# Patient Record
Sex: Male | Born: 1985 | Race: White | Hispanic: No | Marital: Single | Smoking: Former smoker
Health system: Southern US, Community
[De-identification: ages and names within clinical notes are randomized; demographics above are authoritative.]

## PROBLEM LIST (undated history)

## (undated) DIAGNOSIS — G4730 Sleep apnea, unspecified: Secondary | ICD-10-CM

## (undated) DIAGNOSIS — F988 Other specified behavioral and emotional disorders with onset usually occurring in childhood and adolescence: Secondary | ICD-10-CM

## (undated) DIAGNOSIS — F329 Major depressive disorder, single episode, unspecified: Secondary | ICD-10-CM

## (undated) DIAGNOSIS — I319 Disease of pericardium, unspecified: Secondary | ICD-10-CM

## (undated) DIAGNOSIS — T6701XA Heatstroke and sunstroke, initial encounter: Secondary | ICD-10-CM

## (undated) DIAGNOSIS — M6282 Rhabdomyolysis: Secondary | ICD-10-CM

## (undated) DIAGNOSIS — F4310 Post-traumatic stress disorder, unspecified: Secondary | ICD-10-CM

## (undated) DIAGNOSIS — F1020 Alcohol dependence, uncomplicated: Secondary | ICD-10-CM

## (undated) DIAGNOSIS — F32A Depression, unspecified: Secondary | ICD-10-CM

## (undated) HISTORY — DX: Depression, unspecified: F32.A

## (undated) HISTORY — DX: Post-traumatic stress disorder, unspecified: F43.10

## (undated) HISTORY — DX: Sleep apnea, unspecified: G47.30

## (undated) HISTORY — DX: Major depressive disorder, single episode, unspecified: F32.9

## (undated) HISTORY — DX: Other specified behavioral and emotional disorders with onset usually occurring in childhood and adolescence: F98.8

## (undated) HISTORY — PX: KNEE SURGERY: SHX244

## (undated) HISTORY — PX: MUSCLE BIOPSY: SHX716

---
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
## 2007-06-14 ENCOUNTER — Emergency Department (HOSPITAL_COMMUNITY): Admission: EM | Admit: 2007-06-14 | Discharge: 2007-06-14 | Payer: Self-pay | Admitting: Emergency Medicine

## 2008-12-04 ENCOUNTER — Emergency Department (HOSPITAL_COMMUNITY): Admission: EM | Admit: 2008-12-04 | Discharge: 2008-12-04 | Payer: Self-pay | Admitting: Emergency Medicine

## 2010-01-23 ENCOUNTER — Encounter: Payer: Self-pay | Admitting: Orthopaedic Surgery

## 2010-04-05 LAB — BASIC METABOLIC PANEL WITH GFR
Calcium: 9 mg/dL (ref 8.4–10.5)
GFR calc Af Amer: >60 mL/min (ref >60)
GFR calc non Af Amer: >60 mL/min (ref >60)
Glucose, Bld: 91 mg/dL (ref 70–99)
Sodium: 139 meq/L (ref 135–145)

## 2010-04-05 LAB — DIFFERENTIAL
Basophils Absolute: 0 10*3/uL (ref 0.0–0.1)
Basophils Relative: 0 % (ref 0–1)
Eosinophils Absolute: 0.2 10*3/uL (ref 0.0–0.7)
Eosinophils Relative: 3 % (ref 0–5)
Lymphocytes Relative: 18 % (ref 12–46)
Lymphs Abs: 1.5 10*3/uL (ref 0.7–4.0)
Monocytes Absolute: 0.8 10*3/uL (ref 0.1–1.0)
Monocytes Relative: 10 % (ref 3–12)
Neutro Abs: 5.6 K/uL (ref 1.7–7.7)
Neutrophils Relative %: 69 % (ref 43–77)

## 2010-04-05 LAB — URINALYSIS, ROUTINE W REFLEX MICROSCOPIC
Bilirubin Urine: NEGATIVE
Glucose, UA: NEGATIVE mg/dL
Hgb urine dipstick: NEGATIVE
Ketones, ur: NEGATIVE mg/dL
Nitrite: NEGATIVE
Protein, ur: NEGATIVE mg/dL
Specific Gravity, Urine: 1.025 (ref 1.005–1.030)
Urobilinogen, UA: 0.2 mg/dL (ref 0.0–1.0)
pH: 6.5 (ref 5.0–8.0)

## 2010-04-05 LAB — CK
Total CK: 5500 U/L — ABNORMAL HIGH (ref 7–232)
Total CK: 6325 U/L — ABNORMAL HIGH (ref 7–232)

## 2010-04-05 LAB — BASIC METABOLIC PANEL
BUN: 16 mg/dL (ref 6–23)
CO2: 27 mEq/L (ref 19–32)
Chloride: 105 mEq/L (ref 96–112)
Creatinine, Ser: 0.84 mg/dL (ref 0.4–1.5)
Potassium: 3.7 mEq/L (ref 3.5–5.1)

## 2010-04-05 LAB — CBC
HCT: 44 % (ref 39.0–52.0)
Hemoglobin: 15.5 g/dL (ref 13.0–17.0)
MCHC: 35.3 g/dL (ref 30.0–36.0)
MCV: 90.5 fL (ref 78.0–100.0)
Platelets: 226 10*3/uL (ref 150–400)
RBC: 4.86 MIL/uL (ref 4.22–5.81)
RDW: 12.7 % (ref 11.5–15.5)
WBC: 8.2 10*3/uL (ref 4.0–10.5)

## 2010-05-20 NOTE — Op Note (Signed)
Vincent Ponce, Vincent Ponce             ACCOUNT NO.:  [id number]   MEDICAL RECORD NO.:  [id number]          PATIENT TYPE:  AMB   LOCATION:  [facility]                          FACILITY:  [facility]   PHYSICIAN:  Vincent Ponce, M.D.DATE OF BIRTH:  04/24/1985   DATE OF PROCEDURE:  07/19/2004  DATE OF DISCHARGE:                                 OPERATIVE REPORT   PREOPERATIVE DIAGNOSES:  1.  Left knee torn medial meniscus.  2.  Left knee retained hardware.   POSTOPERATIVE DIAGNOSES:  1.  Left knee torn medial meniscus.  2.  Left knee retained hardware.   PROCEDURES:  1.  Left knee partial medial meniscectomy.  2.  Left knee attempted hardware removal.   ANESTHESIA:  General.   ATTENDING SURGEON:  Vincent Ponce, M.D.   ASSISTANT:  Vincent Ponce, P.A.   INDICATION FOR PROCEDURE:  The patient is a 25 year old male a couple of  years from an [facility]once reconstruction.  Unfortunately, he injured his knee  recently and by MRI and exam, has a torn medial meniscus which is displaced  and locked in an unfavorable position.  The Vincent Ponce appears to be intact.  He is  offered a partial medial meniscectomy through the scope.  He is planning to  enter the [facility]once and wishes to have his hardware removed as well as  that might affect his admission to the service.  Informed operative consent  was obtained after discussion of possible complications of reaction to  anesthesia and infection.   DESCRIPTION OF PROCEDURE:  The patient was in an operating suite where a  general anesthetic was applied without difficulty.  He was positioned supine  and prepped and draped in normal sterile fashion.  After the administration  of preop IV antibiotics, an arthroscopy of the left knee was performed  through 2 old inferior portals.  The suprapatellar pouch was benign, as was  the patellofemoral joint.  Medial compartment did exhibit an avascular  medial meniscus tear, bucket-handle, stuck in an anterior position.  About  a  15% partial medial meniscectomy was done, removing this tissue.  The rest of  the meniscus appeared intact and was shaved back to stable margins.  There  were no degenerative changes in this compartment.  The Vincent Ponce appeared to be  intact and functional.  The PCL also was intact.  The lateral compartment  exhibited no evidence of meniscal or articular cartilage injury.  At this  point, I probed the proximal portion of the Vincent Ponce origin and could not find  any obvious hardware.  I used a shaver to remove some of the soft tissue in  this area and could not see any exposed hardware.  We then brought the  fluoroscopy machine into place and I was in the correct location, but it  appeared as though the hardware was likely buried.  I was concerned that  this might destabilize his Vincent Ponce reconstruction to dig after this hardware and  elected not to remove the screw.  The knee was thoroughly irrigated followed  by placement Marcaine with epinephrine and morphine.  Simple sutures of  nylon were used to loosely reapproximate the 2 portals, plus an additional  central portal which was made for probing the Vincent Ponce insertion site.  Estimated  blood loss and intraoperative fluids can be obtained from anesthesia  records.   DISPOSITION:  The patient was extubated in the operating room and taken to  recovery room in stable condition.  Plans were for him to go home the same  day and to follow up in the office in less than a week.  I will contact him  by phone tonight.       PGD/MEDQ  D:  07/19/2004  T:  07/20/2004  Job:  [id number]

## 2010-05-20 NOTE — Op Note (Signed)
NAME:  Vincent Ponce, Vincent Ponce                       ACCOUNT NO.:  [id number]   MEDICAL RECORD NO.:  [id number]                   PATIENT TYPE:  AMB   LOCATION:  [facility]                                  FACILITY:  [facility]   PHYSICIAN:  Lubertha Basque. Jerl Santos, M.D.             DATE OF BIRTH:  1985-02-22   DATE OF PROCEDURE:  09/19/2001  DATE OF DISCHARGE:  09/19/2001                                 OPERATIVE REPORT   PREOPERATIVE DIAGNOSES:  1. Left knee anterior cruciate ligament tear.  2. Left knee torn medial meniscus.   POSTOPERATIVE DIAGNOSES:  1. Left knee anterior cruciate ligament tear.  2. Left knee chondromalacia of the patella.   PROCEDURE:  1. Left knee anterior cruciate ligament reconstruction.  2. Left knee chrondroplasty.   ANESTHESIA:  General and postoperative block.   ATTENDING SURGEON:  Lubertha Basque. Jerl Santos, M.D.   ASSISTANT:  Prince Rome, P.A.   INDICATIONS FOR PROCEDURE:  The patient is a 25 year old boy, a few weeks  from a sports injury where his knee buckled and gave away.  It swelled in an  immediate fashion.  He has been left with instability on exam, and an MRI  shows a complete ACL tear.  He would like to be involved once again in  cutting and jumping sports, and as a result, he is having operative ACL  reconstruction in hopes of stabilizing his knee enough to allow  participation in those activities.  The procedure was discussed with patient  and his father, and an informed operative consent was obtained after  discussing the possible complications of reaction to anesthesia, infection,  and knee stiffness.  He also understood about the extensive rehabilitation  program required after this type of intervention.   DESCRIPTION OF PROCEDURE:  The patient was brought to the operating suite  where a general anesthetic was started without difficulty.  He was  positioned supine and prepped and draped in a normal sterile fashion.  After  the administration of  preoperative antibiotics, an arthroscopy of the left  knee was performed through two inferior portals.  The suprapatellar pouch  was benign while the patellofemoral joint exhibited some mild  chondromalacia, dressed with a brief chondroplasty of the patella.  The  patella tracked well.  Medial and lateral compartments exhibited no evidence  of meniscal articular cartilage injury.  The ACL was found to be torn at the  femoral attachment.  The PCL was intact.  The ACL was removed, followed by a  thorough notchplasty with an arthroscopic bur.  At this point, the leg was  elevated, exsanguinated, and a tourniquet inflated about the thigh.  Anteromedially, an incision was made along the medial border of the patellar  tendon.  Dissection was carried down to this structure through the  peritenon.  The middle third of the patellar tendon was harvested with  contiguous bone blocks from the tibial tubercle and the patella.  This  structure was taken to a back table where __________ fit through 10 and 11  mm tunnels.  Drill holes were placed in both of the bone plugs with a wire  placed to one and a suture placed on the other.  A guide was placed inside  the knee which made possible the placement of a guide wire from anterior  into the knee, just anterior to the PCL insertion on the tibia.  This was  overeamed to a diameter of 11 mm.  A transtibial guide was then placed  through this tunnel in the over-the-top position.  This was utilized to  place a guide wire through the tibial tunnel, the distal femur, and the  thigh.  We then used this guide wire to overream the distal femur to a  diameter of 10 mm and a depth of 3 cm with a 1-2 mm posterior wall being  well-visualized.  A full radius resector was placed in the knee to remove  bony debris.  The aforementioned graft was then pulled into position through  the tibial tunnel and into the femoral tunnel.  Care was taken to keep the  tendinous surface  of the graft in a posterior position.  An 8 x 25 metal  Linvatec screw was then placed over a guide wire in the femoral tunnel.  This secured this bone plug very well.  The knee was then ranged fully, and  the graft was found to be quite isometric with no impingement at full  extension.  A second guide wire was placed through the tibial tunnel and was  seen to enter the knee.  An identical screw was placed in identical  interference fashion over this guide wire to secure the trailing bone plug  in the tibial tunnel.  Again, the knee ranged fully, and the graft was found  to be quite taut.  The knee was irrigated, followed by removal of  arthroscopic equipment.  Some residual bone chips from graft preparation  were placed into the patellar defect, followed by closure of the peritenon  with 0 Vicryl suture in a running fashion.  The tourniquet was deflated.  A  small amount of bleeding was easily controlled with Bovie cautery.  2-0  undyed Vicryl was used to reapproximate the subcutaneous tissues, followed  by nylon in the skin.  Adaptic was placed over the wound, followed by dry  gauze and a loose Ace wrap.  A femoral nerve block was scheduled to be given  by the anesthesiologist at the end of the case.  Estimated blood loss,  intraoperative fluids, as well as tourniquet time can be obtained from the  anesthesia records.   DISPOSITION:  The patient was extubated in the operating room and taken to  the recovery room in a stable condition. Plans were for him to stay  overnight for pain control with probable discharge home in the morning.                                               Lubertha Basque Jerl Santos, M.D.    PGD/MEDQ  D:  09/19/2001  T:  09/21/2001  Job:  [id number]

## 2010-09-29 LAB — INFLUENZA A+B VIRUS AG-DIRECT(RAPID)
Inflenza A Ag: NEGATIVE
Influenza B Ag: NEGATIVE

## 2011-03-31 ENCOUNTER — Encounter (HOSPITAL_COMMUNITY): Payer: Self-pay | Admitting: *Deleted

## 2011-03-31 ENCOUNTER — Emergency Department (HOSPITAL_COMMUNITY)
Admission: EM | Admit: 2011-03-31 | Discharge: 2011-03-31 | Disposition: A | Discharge status: Home / Self Care | Payer: 59 | Attending: Emergency Medicine | Admitting: Emergency Medicine

## 2011-03-31 DIAGNOSIS — R110 Nausea: Secondary | ICD-10-CM | POA: Insufficient documentation

## 2011-03-31 DIAGNOSIS — IMO0001 Reserved for inherently not codable concepts without codable children: Secondary | ICD-10-CM | POA: Insufficient documentation

## 2011-03-31 DIAGNOSIS — R5383 Other fatigue: Secondary | ICD-10-CM | POA: Insufficient documentation

## 2011-03-31 DIAGNOSIS — R5381 Other malaise: Secondary | ICD-10-CM | POA: Insufficient documentation

## 2011-03-31 DIAGNOSIS — E860 Dehydration: Secondary | ICD-10-CM | POA: Insufficient documentation

## 2011-03-31 DIAGNOSIS — M6282 Rhabdomyolysis: Secondary | ICD-10-CM

## 2011-03-31 DIAGNOSIS — K5289 Other specified noninfective gastroenteritis and colitis: Secondary | ICD-10-CM | POA: Insufficient documentation

## 2011-03-31 DIAGNOSIS — R197 Diarrhea, unspecified: Secondary | ICD-10-CM | POA: Insufficient documentation

## 2011-03-31 DIAGNOSIS — K529 Noninfective gastroenteritis and colitis, unspecified: Secondary | ICD-10-CM

## 2011-03-31 HISTORY — DX: Heatstroke and sunstroke, initial encounter: T67.01XA

## 2011-03-31 HISTORY — DX: Rhabdomyolysis: M62.82

## 2011-03-31 HISTORY — DX: Alcohol dependence, uncomplicated: F10.20

## 2011-03-31 LAB — BASIC METABOLIC PANEL
CO2: 24 mEq/L (ref 19–32)
Chloride: 98 mEq/L (ref 96–112)
GFR calc Af Amer: >90 mL/min (ref >90)
Potassium: 3.4 mEq/L — ABNORMAL LOW (ref 3.5–5.1)

## 2011-03-31 LAB — CK: Total CK: 953 U/L — ABNORMAL HIGH (ref 7–232)

## 2011-03-31 LAB — URINALYSIS, ROUTINE W REFLEX MICROSCOPIC
Glucose, UA: NEGATIVE mg/dL
Leukocytes, UA: NEGATIVE
Nitrite: NEGATIVE
Specific Gravity, Urine: <1.005 — ABNORMAL LOW (ref 1.005–1.030)
pH: 6 (ref 5.0–8.0)

## 2011-03-31 LAB — URINE MICROSCOPIC-ADD ON

## 2011-03-31 MED ORDER — DIPHENOXYLATE-ATROPINE 2.5-0.025 MG PO TABS
1.0000 | ORAL_TABLET | Freq: Once | ORAL | Status: AC
  Administered 2011-03-31: 1 via ORAL
  Filled 2011-03-31: qty 1

## 2011-03-31 MED ORDER — PROMETHAZINE HCL 25 MG PO TABS: 25.0000 mg | ORAL_TABLET | Freq: Four times a day (QID) | ORAL | Status: DC | PRN

## 2011-03-31 MED ORDER — SODIUM CHLORIDE 0.9 % IV BOLUS (SEPSIS)
1000.0000 mL | Freq: Once | INTRAVENOUS | Status: AC
  Administered 2011-03-31: 1000 mL via INTRAVENOUS

## 2011-03-31 MED ORDER — ONDANSETRON HCL 4 MG/2ML IJ SOLN
4.0000 mg | Freq: Once | INTRAMUSCULAR | Status: AC
  Administered 2011-03-31: 4 mg via INTRAVENOUS
  Filled 2011-03-31: qty 2

## 2011-03-31 NOTE — Discharge Instructions (Signed)
Dehydration, Adult Dehydration is when you lose more fluids from the body than you take in. Vital organs like the kidneys, brain, and heart cannot function without a proper amount of fluids and salt. Any loss of fluids from the body can cause dehydration.  CAUSES   Vomiting.   Diarrhea.   Excessive sweating.   Excessive urine output.   Fever.  SYMPTOMS  Mild dehydration  Thirst.   Dry lips.   Slightly dry mouth.  Moderate dehydration  Very dry mouth.   Sunken eyes.   Skin does not bounce back quickly when lightly pinched and released.   Dark urine and decreased urine production.   Decreased tear production.   Headache.  Severe dehydration  Very dry mouth.   Extreme thirst.   Rapid, weak pulse (more than 100 beats per minute at rest).   Cold hands and feet.   Not able to sweat in spite of heat and temperature.   Rapid breathing.   Blue lips.   Confusion and lethargy.   Difficulty being awakened.   Minimal urine production.   No tears.  DIAGNOSIS  Your caregiver will diagnose dehydration based on your symptoms and your exam. Blood and urine tests will help confirm the diagnosis. The diagnostic evaluation should also identify the cause of dehydration. TREATMENT  Treatment of mild or moderate dehydration can often be done at home by increasing the amount of fluids that you drink. It is best to drink small amounts of fluid more often. Drinking too much at one time can make vomiting worse. Refer to the home care instructions below. Severe dehydration needs to be treated at the hospital where you will probably be given intravenous (IV) fluids that contain water and electrolytes. HOME CARE INSTRUCTIONS   Ask your caregiver about specific rehydration instructions.   Drink enough fluids to keep your urine clear or pale yellow.   Drink small amounts frequently if you have nausea and vomiting.   Eat as you normally do.   Avoid:   Foods or drinks high in  sugar.   Carbonated drinks.   Juice.   Extremely hot or cold fluids.   Drinks with caffeine.   Fatty, greasy foods.   Alcohol.   Tobacco.   Overeating.   Gelatin desserts.   Wash your hands well to avoid spreading bacteria and viruses.   Only take over-the-counter or prescription medicines for pain, discomfort, or fever as directed by your caregiver.   Ask your caregiver if you should continue all prescribed and over-the-counter medicines.   Keep all follow-up appointments with your caregiver.  SEEK MEDICAL CARE IF:  You have abdominal pain and it increases or stays in one area (localizes).   You have a rash, stiff neck, or severe headache.   You are irritable, sleepy, or difficult to awaken.   You are weak, dizzy, or extremely thirsty.  SEEK IMMEDIATE MEDICAL CARE IF:   You are unable to keep fluids down or you get worse despite treatment.   You have frequent episodes of vomiting or diarrhea.   You have blood or green matter (bile) in your vomit.   You have blood in your stool or your stool looks black and tarry.   You have not urinated in 6 to 8 hours, or you have only urinated a small amount of very dark urine.   You have a fever.   You faint.  MAKE SURE YOU:   Understand these instructions.   Will watch your condition.     Will get help right away if you are not doing well or get worse.  Document Released: 12/19/2004 Document Revised: 12/08/2010 Document Reviewed: 08/08/2010 [employer] Patient Information 2012 [employer]. 

## 2011-03-31 NOTE — ED Notes (Signed)
Pt alert & oriented x4, stable gait. Pt given discharge instructions, paperwork & prescription(s). Patient instructed to stop at the registration desk to finish any additional paperwork. pt verbalized understanding. Pt left department w/ no further questions.  

## 2011-03-31 NOTE — ED Provider Notes (Signed)
History     CSN: [id number]  Arrival date & time 03/31/11  1709   First MD Initiated Contact with Patient 03/31/11 1744      Chief Complaint  Patient presents with  . Fatigue  . Diarrhea    (Consider location/radiation/quality/duration/timing/severity/associated sxs/prior treatment) Patient is a 26 y.o. male presenting with diarrhea. The history is provided by the patient. No language interpreter was used.  Diarrhea The primary symptoms include fatigue, nausea, diarrhea and myalgias. Primary symptoms do not include fever, weight loss, abdominal pain, vomiting, dysuria or arthralgias. The illness began yesterday. The onset was gradual. The problem has been gradually worsening.  The fatigue began yesterday. The fatigue has been worsening since its onset. The fatigue is worsened by exercise.  Nausea began yesterday. The nausea is associated with exercise.  The diarrhea began yesterday. The diarrhea is watery. The diarrhea occurs 5 to 10 times per day.  The myalgias are associated with weakness (generalized).  The illness does not include chills, anorexia, dysphagia, constipation or back pain.    Past Medical History  Diagnosis Date  . Rhabdomyolysis   . Heat stroke   . Alcoholism     1 year ago.    Past Surgical History  Procedure Date  . Knee surgery   . Muscle biopsy     No family history on file.  History  Substance Use Topics  . Smoking status: Never Smoker   . Smokeless tobacco: Current User    Types: Snuff  . Alcohol Use: No      Review of Systems  Constitutional: Positive for activity change, appetite change and fatigue. Negative for fever, chills and weight loss.  HENT: Negative for congestion, sore throat, rhinorrhea, neck pain and neck stiffness.   Respiratory: Negative for cough and shortness of breath.   Cardiovascular: Negative for chest pain and palpitations.  Gastrointestinal: Positive for nausea and diarrhea. Negative for dysphagia, vomiting,  abdominal pain, constipation, blood in stool and anorexia.  Genitourinary: Negative for dysuria, urgency, frequency and flank pain.  Musculoskeletal: Positive for myalgias. Negative for back pain and arthralgias.  Neurological: Positive for weakness (generalized). Negative for dizziness, light-headedness, numbness and headaches.  All other systems reviewed and are negative.    Allergies  Poison sumac extract  Home Medications   Current Outpatient Rx  Name Route Sig Dispense Refill  . TOTAL BODY CLEANSE PO Oral Take by mouth daily. For 7 days    . PROMETHAZINE HCL 25 MG PO TABS Oral Take 1 tablet (25 mg total) by mouth every 6 (six) hours as needed for nausea. 20 tablet 0    BP 125/71  Pulse 103  Temp(Src) 98.2 F (36.8 C) (Oral)  Resp 20  Ht 6\' 2"  (1.88 m)  Wt 300 lb (136.079 kg)  BMI 38.52 kg/m2  SpO2 97%  Physical Exam  Nursing note and vitals reviewed. Constitutional: He is oriented to person, place, and time. He appears well-developed and well-nourished. No distress.  HENT:  Head: Normocephalic and atraumatic.  Mouth/Throat: Oropharynx is clear and moist. No oropharyngeal exudate.  Eyes: Conjunctivae and EOM are normal. Pupils are equal, round, and reactive to light.  Neck: Normal range of motion. Neck supple.  Cardiovascular: Regular rhythm, normal heart sounds and intact distal pulses.  Exam reveals no gallop and no friction rub.   No murmur heard.      Tachycardic rate  Pulmonary/Chest: Effort normal and breath sounds normal. No respiratory distress.  Abdominal: Soft. Bowel sounds are normal. There is  no tenderness.  Musculoskeletal: Normal range of motion. He exhibits no tenderness.  Neurological: He is alert and oriented to person, place, and time. No cranial nerve deficit.  Skin: Skin is warm. No rash noted.    ED Course  Procedures (including critical care time)  Labs Reviewed  BASIC METABOLIC PANEL - Abnormal; Notable for the following:    Sodium 134  (*)    Potassium 3.4 (*)    All other components within normal limits  URINALYSIS, ROUTINE W REFLEX MICROSCOPIC - Abnormal; Notable for the following:    Specific Gravity, Urine <1.005 (*)    Hgb urine dipstick TRACE (*)    All other components within normal limits  CK - Abnormal; Notable for the following:    Total CK 953 (*)    All other components within normal limits  URINE MICROSCOPIC-ADD ON   No results found.   1. Rhabdomyolysis   2. Dehydration   3. Gastroenteritis       MDM  Diarrhea with mild rhabdomyolysis. I have no concern about renal impairment as levels line 900. He received 2 L of fluid and is able to tolerate oral hydration. Will be discharged home with instructions to continue aggressive oral hydration. Instructed to take over-the-counter Imodium as needed for diarrhea. Will be provided Phenergan. I offered additional IV fluids however the patient was to be discharged home.        Dayton Bailiff, MD 03/31/11 2042

## 2011-03-31 NOTE — ED Notes (Signed)
Pt states previous hx of "heat stroke and rhabdo" while in the [employer]. States he went for a run yesterday ("haven't exercised in a while") and after running, fingers and toes began tingling. "Happens a lot when I jog or run" Diarrhea began yesterday. Denies vomiting. Nausea is present. Weakness.

## 2012-05-03 ENCOUNTER — Encounter: Payer: Self-pay | Admitting: Nurse Practitioner

## 2012-05-03 ENCOUNTER — Ambulatory Visit (INDEPENDENT_AMBULATORY_CARE_PROVIDER_SITE_OTHER): Payer: 59 | Admitting: Nurse Practitioner

## 2012-05-03 VITALS — BP 138/84 | Temp 98.3°F | Wt 285.0 lb

## 2012-05-03 DIAGNOSIS — H6123 Impacted cerumen, bilateral: Secondary | ICD-10-CM

## 2012-05-03 DIAGNOSIS — J309 Allergic rhinitis, unspecified: Secondary | ICD-10-CM

## 2012-05-03 DIAGNOSIS — J300 Vasomotor rhinitis: Secondary | ICD-10-CM

## 2012-05-03 DIAGNOSIS — H6120 Impacted cerumen, unspecified ear: Secondary | ICD-10-CM

## 2012-05-03 NOTE — Patient Instructions (Signed)
Mix warm water with hydrogen peroxide 1:1 and irrigate ears several times per week Mix tsp baking soda in a cup warm water

## 2012-05-03 NOTE — Progress Notes (Signed)
Bilateral ear irrigation completed as per ordered.

## 2012-05-03 NOTE — Progress Notes (Signed)
Subjective:  Presents with complaints of ear pressure/fullness and decreased hearing in both ears more so on the left side is been going on for several weeks. No fever cough sore throat or headache. Has not been using his Flonase or antihistamine. History of recurrent cerumen impaction.  Objective:   BP 138/84  Temp(Src) 98.3 F (36.8 C) (Oral)  Wt 285 lb (129.275 kg)  BMI 36.58 kg/m2 NAD. Alert, oriented. Initially left TM completely obstructed with cerumen, right TM almost obscured. Both the ears were irrigated without difficulty by the nurse. TMs mild clear effusion bilateral, no erythema. Pharynx clear. Neck supple with mild soft adenopathy. Lungs clear. Heart regular rate rhythm.  Assessment:Cerumen impaction, bilateral - Plan: Ear wax removal  Vasomotor rhinitis  Plan: Discussed measures to help prevent further impactions. Restart Flonase and antihistamine as directed. Recheck if symptoms worsen or persist.

## 2012-09-09 ENCOUNTER — Ambulatory Visit (INDEPENDENT_AMBULATORY_CARE_PROVIDER_SITE_OTHER): Payer: 59 | Admitting: Family Medicine

## 2012-09-09 ENCOUNTER — Encounter: Payer: Self-pay | Admitting: Family Medicine

## 2012-09-09 DIAGNOSIS — T6391XA Toxic effect of contact with unspecified venomous animal, accidental (unintentional), initial encounter: Secondary | ICD-10-CM

## 2012-09-09 DIAGNOSIS — T63461A Toxic effect of venom of wasps, accidental (unintentional), initial encounter: Secondary | ICD-10-CM

## 2012-09-09 DIAGNOSIS — Z9189 Other specified personal risk factors, not elsewhere classified: Secondary | ICD-10-CM

## 2012-09-09 DIAGNOSIS — Z8739 Personal history of other diseases of the musculoskeletal system and connective tissue: Secondary | ICD-10-CM

## 2012-09-09 DIAGNOSIS — M6282 Rhabdomyolysis: Secondary | ICD-10-CM

## 2012-09-09 DIAGNOSIS — Z0189 Encounter for other specified special examinations: Secondary | ICD-10-CM

## 2012-09-09 LAB — HEPATIC FUNCTION PANEL
Albumin: 4.6 g/dL (ref 3.5–5.2)
Indirect Bilirubin: 0.4 mg/dL (ref 0.0–0.9)
Total Protein: 6.9 g/dL (ref 6.0–8.3)

## 2012-09-09 LAB — CK: Total CK: 348 U/L — ABNORMAL HIGH (ref 7–232)

## 2012-09-09 LAB — CBC WITH DIFFERENTIAL/PLATELET
Basophils Absolute: 0 10*3/uL (ref 0.0–0.1)
Eosinophils Relative: 1 % (ref 0–5)
Lymphocytes Relative: 17 % (ref 12–46)
MCV: 85.8 fL (ref 78.0–100.0)
Neutrophils Relative %: 71 % (ref 43–77)
Platelets: 276 10*3/uL (ref 150–400)
RDW: 13.3 % (ref 11.5–15.5)
WBC: 9.2 10*3/uL (ref 4.0–10.5)

## 2012-09-09 LAB — BASIC METABOLIC PANEL
BUN: 15 mg/dL (ref 6–23)
Calcium: 9 mg/dL (ref 8.4–10.5)
Creat: 0.93 mg/dL (ref 0.50–1.35)
Glucose, Bld: 92 mg/dL (ref 70–99)

## 2012-09-09 NOTE — Progress Notes (Signed)
  Subjective:    Patient ID: Lucy Antigua, male    DOB: Jun 24, 1985, 27 y.o.   MRN: [id number]  HPI      patient relates that he had some muscle soreness along with discomfort in his legs after being stung by 2 yellow jackets. 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 Headache with heat-he relates he gets headache with working in the heat all the time. 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  Alert allergy do to staying stash don't feel the patient needs EpiPen at this point warning signs regarding emergent reaction was discussed

## 2012-09-17 ENCOUNTER — Telehealth: Payer: Self-pay | Admitting: Family Medicine

## 2012-09-17 NOTE — Telephone Encounter (Signed)
Discussed with mother

## 2012-09-17 NOTE — Telephone Encounter (Signed)
Patients mother has recently been diagnosed with celiac disease and mom is wanting BW paperwork drawn up to check patient.

## 2012-09-17 NOTE — Telephone Encounter (Signed)
Will need o v first 

## 2012-09-17 NOTE — Telephone Encounter (Signed)
Left message to return call 

## 2012-10-29 ENCOUNTER — Encounter: Payer: Self-pay | Admitting: Family Medicine

## 2012-10-29 ENCOUNTER — Ambulatory Visit (INDEPENDENT_AMBULATORY_CARE_PROVIDER_SITE_OTHER): Payer: 59 | Admitting: Family Medicine

## 2012-10-29 VITALS — BP 114/70 | Temp 98.5°F | Ht 74.0 in | Wt 283.0 lb

## 2012-10-29 DIAGNOSIS — R21 Rash and other nonspecific skin eruption: Secondary | ICD-10-CM

## 2012-10-29 DIAGNOSIS — R197 Diarrhea, unspecified: Secondary | ICD-10-CM

## 2012-10-29 MED ORDER — KETOCONAZOLE 2 % EX CREA: TOPICAL_CREAM | Freq: Two times a day (BID) | CUTANEOUS | Status: DC

## 2012-10-29 MED ORDER — CIPROFLOXACIN HCL 500 MG PO TABS: 500.0000 mg | ORAL_TABLET | Freq: Two times a day (BID) | ORAL | Status: AC

## 2012-10-29 NOTE — Progress Notes (Signed)
  Subjective:    Patient ID: Vincent Ponce, male    DOB: 1985-08-02, 27 y.o.   MRN: [id number]  Abdominal Pain This is a new problem. The current episode started yesterday. The quality of the pain is cramping. Associated symptoms include diarrhea. Associated symptoms comments: Blood in stool. Treatments tried: pepto.   Struck just yesterday, went straight to the bathroom, felt constipated  Stool loose then diarrhea, a lot of cramping.  Diminished intake, ate broth and pepto bismol  Felt cramping. Did eat some wild [place] a couple of days ago  Also notes a rash in the groin area pruritic unresponsive to over-the-counter agents Review of Systems  Gastrointestinal: Positive for abdominal pain and diarrhea.   otherwise negative     Objective:   Physical Exam  Alert no acute distress. Lungs clear heart regular in rhythm afebrile abdomen good bowel sounds some left lower quadrant tenderness no rebound no guarding positive tenderness to deep palpation  Rectal exam stool brown heme positive  Yeastlike dermatitis and groin      Assessment & Plan:  Impression gastroenteritis with bloody stools. May well represent a bacterial colitis discussed #2 groin dermatitis likely yeast or fungal in etiology discussed plan warning signs discussed Cipro twice a day 10 days. Ketoconazole cream twice a day to affected area. Diet discussed. 25 minutes spent most in discussion WSL

## 2012-10-30 ENCOUNTER — Ambulatory Visit: Payer: 59 | Admitting: Family Medicine

## 2014-10-31 ENCOUNTER — Emergency Department (HOSPITAL_COMMUNITY)
Admission: EM | Admit: 2014-10-31 | Discharge: 2014-10-31 | Disposition: A | Discharge status: Home / Self Care | Payer: Worker's Compensation | Attending: Emergency Medicine | Admitting: Emergency Medicine

## 2014-10-31 ENCOUNTER — Emergency Department (HOSPITAL_COMMUNITY): Payer: Worker's Compensation

## 2014-10-31 ENCOUNTER — Encounter (HOSPITAL_COMMUNITY): Payer: Self-pay | Admitting: *Deleted

## 2014-10-31 DIAGNOSIS — S90121A Contusion of right lesser toe(s) without damage to nail, initial encounter: Secondary | ICD-10-CM

## 2014-10-31 DIAGNOSIS — Z8659 Personal history of other mental and behavioral disorders: Secondary | ICD-10-CM | POA: Insufficient documentation

## 2014-10-31 DIAGNOSIS — S99921A Unspecified injury of right foot, initial encounter: Secondary | ICD-10-CM | POA: Diagnosis present

## 2014-10-31 DIAGNOSIS — Y990 Civilian activity done for income or pay: Secondary | ICD-10-CM | POA: Insufficient documentation

## 2014-10-31 DIAGNOSIS — Z8739 Personal history of other diseases of the musculoskeletal system and connective tissue: Secondary | ICD-10-CM | POA: Diagnosis not present

## 2014-10-31 DIAGNOSIS — Z8669 Personal history of other diseases of the nervous system and sense organs: Secondary | ICD-10-CM | POA: Insufficient documentation

## 2014-10-31 DIAGNOSIS — S9031XA Contusion of right foot, initial encounter: Secondary | ICD-10-CM | POA: Insufficient documentation

## 2014-10-31 DIAGNOSIS — Y9289 Other specified places as the place of occurrence of the external cause: Secondary | ICD-10-CM | POA: Insufficient documentation

## 2014-10-31 DIAGNOSIS — Z87891 Personal history of nicotine dependence: Secondary | ICD-10-CM | POA: Insufficient documentation

## 2014-10-31 DIAGNOSIS — Y9389 Activity, other specified: Secondary | ICD-10-CM | POA: Diagnosis not present

## 2014-10-31 DIAGNOSIS — W230XXA Caught, crushed, jammed, or pinched between moving objects, initial encounter: Secondary | ICD-10-CM | POA: Insufficient documentation

## 2014-10-31 NOTE — ED Notes (Signed)
Pt was carrying pallets on a lift and the lift rolled over his foot. NAD noted at this time.

## 2014-10-31 NOTE — Discharge Instructions (Signed)
Your x-ray is negative for fracture or dislocation. Please apply ice and elevate her foot is much as possible. Please see Dr.Luking for additional evaluation if not improving. Foot Contusion  A foot contusion is a deep bruise to the foot. Contusions happen when an injury causes bleeding under the skin. Signs of bruising include pain, puffiness (swelling), and discolored skin. The contusion may turn blue, purple, or yellow. HOME CARE  Put ice on the injured area.  Put ice in a plastic bag.  Place a towel between your skin and the bag.  Leave the ice on for 15-20 minutes, 03-04 times a day.  Only take medicines as told by your doctor.  Use an elastic wrap only as told. You may remove the wrap for sleeping, showering, and bathing. Take the wrap off if you lose feeling (numb) in your toes, or they turn blue or cold. Put the wrap on more loosely.  Keep the foot raised (elevated) with pillows.  If your foot hurts, avoid standing or walking.  When your doctor says it is okay to use your foot, start using it slowly. If you have pain, lessen how much you use your foot.  See your doctor as told. GET HELP RIGHT AWAY IF:   You have more redness, puffiness, or pain in your foot.  Your puffiness or pain does not get better with medicine.  You lose feeling in your foot, or you cannot move your toes.  Your foot turns cold or blue.  You have pain when you move your toes.  Your foot feels warm.  Your contusion does not get better in 2 days. MAKE SURE YOU:   Understand these instructions.  Will watch this condition.  Will get help right away if you or your child is not doing well or gets worse.   This information is not intended to replace advice given to you by your health care provider. Make sure you discuss any questions you have with your health care provider.   Document Released: 09/28/2007 Document Revised: 06/20/2011 Document Reviewed: 08/25/2014 Elsevier Interactive Patient  Education [employer]2016 Elsevier Inc.

## 2014-10-31 NOTE — ED Provider Notes (Signed)
CSN: [id number]     Arrival date & time 10/31/14  1835 History   First MD Initiated Contact with Patient 10/31/14 1852     Chief Complaint  Patient presents with  . Foot Injury     (Consider location/radiation/quality/duration/timing/severity/associated sxs/prior Treatment) Patient is a 29 y.o. male presenting with foot injury. The history is provided by the patient.  Foot Injury Location:  Foot Injury: yes (Heavy pallet and lift rolled over the right foot at work.)   Foot location:  R foot Pain details:    Quality:  Aching   Radiates to:  Does not radiate   Severity:  Moderate   Onset quality:  Sudden   Timing:  Intermittent   Progression:  Unchanged Chronicity:  New Dislocation: no   Prior injury to area:  No Relieved by:  Nothing Worsened by:  Nothing tried Associated symptoms: no back pain, no neck pain, no numbness and no tingling   Risk factors: no frequent fractures and no known bone disorder     Past Medical History  Diagnosis Date  . Rhabdomyolysis   . Heat stroke   . Alcoholism (HCC)     1 year ago.  . ADD (attention deficit disorder)   . PTSD (post-traumatic stress disorder)   . Sleep apnea   . Depression    Past Surgical History  Procedure Laterality Date  . Knee surgery    . Muscle biopsy     Family History  Problem Relation Age of Onset  . Cancer Mother   . Cancer Maternal Grandmother   . Cancer Maternal Grandfather    Social History  Substance Use Topics  . Smoking status: Former Games developermoker  . Smokeless tobacco: Current User    Types: Snuff  . Alcohol Use: Yes    Review of Systems  Constitutional: Negative for activity change.       All ROS Neg except as noted in HPI  HENT: Negative for nosebleeds.   Eyes: Negative for photophobia and discharge.  Respiratory: Negative for cough, shortness of breath and wheezing.   Cardiovascular: Negative for chest pain and palpitations.  Gastrointestinal: Negative for abdominal pain and blood in stool.    Genitourinary: Negative for dysuria, frequency and hematuria.  Musculoskeletal: Negative for back pain, arthralgias and neck pain.  Skin: Negative.   Neurological: Negative for dizziness, seizures and speech difficulty.  Psychiatric/Behavioral: Negative for hallucinations and confusion.      Allergies  Poison sumac extract  Home Medications   Prior to Admission medications   Not on File   BP 154/94 mmHg  Pulse 93  Temp(Src) 97.8 F (36.6 C) (Oral)  Resp 20  Ht 6\' 2"  (1.88 m)  Wt 300 lb (136.079 kg)  BMI 38.50 kg/m2  SpO2 96% Physical Exam  Constitutional: He is oriented to person, place, and time. He appears well-developed and well-nourished.  Non-toxic appearance.  HENT:  Head: Normocephalic.  Right Ear: Tympanic membrane and external ear normal.  Left Ear: Tympanic membrane and external ear normal.  Eyes: EOM and lids are normal. Pupils are equal, round, and reactive to light.  Neck: Normal range of motion. Neck supple. Carotid bruit is not present.  Cardiovascular: Normal rate, regular rhythm, normal heart sounds, intact distal pulses and normal pulses.   Pulmonary/Chest: Breath sounds normal. No respiratory distress.  Abdominal: Soft. Bowel sounds are normal. There is no tenderness. There is no guarding.  Musculoskeletal: Normal range of motion.       Right foot: There is tenderness  and swelling. There is no deformity and no laceration.       Feet:  Lymphadenopathy:       Head (right side): No submandibular adenopathy present.       Head (left side): No submandibular adenopathy present.    He has no cervical adenopathy.  Neurological: He is alert and oriented to person, place, and time. He has normal strength. No cranial nerve deficit or sensory deficit.  Skin: Skin is warm and dry.  Psychiatric: He has a normal mood and affect. His speech is normal.  Nursing note and vitals reviewed.   ED Course  Procedures (including critical care time) Labs Review Labs  Reviewed - No data to display  Imaging Review Dg Foot Complete Right  10/31/2014  CLINICAL DATA:  30 year old male with a history of right foot injury. Pain fourth and fifth toe EXAM: RIGHT FOOT COMPLETE - 3+ VIEW COMPARISON:  None. FINDINGS: No acute fracture identified. Mild soft tissue swelling overlying the fifth digit of the right foot. No radiopaque foreign body. No subcutaneous emphysema. IMPRESSION: No acute bony abnormality. Soft tissue swelling overlying the fifth digit of the right foot. Signed, Yvone Neu. Loreta Ave, DO [facility] Electronically Signed   By: Gilmer Mor D.O.   On: 10/31/2014 19:34   I have personally reviewed and evaluated these images and lab results as part of my medical decision-making.   EKG Interpretation None      MDM  Vital signs reviewed. X-ray results reviewed with the patient. The x-ray is negative for fracture or dislocation. I would advise the patient to apply ice and to elevate the foot is much as possible. He will return if any changes or problems.    Final diagnoses:  None    **I have reviewed nursing notes, vital signs, and all appropriate lab and imaging results for this patient.    Ivery Quale, PA-C 10/31/14 1946  Mancel Bale, MD 11/01/14 [phone]

## 2016-10-19 IMAGING — DX DG FOOT COMPLETE 3+V*R*
3 series · 3 of 3 positions shown · non-contrast
Comparison: None.

CLINICAL DATA: 28-year-old male with a history of right foot
injury. Pain fourth and fifth toe

EXAM:
RIGHT FOOT COMPLETE - 3+ VIEW

[foot ap]
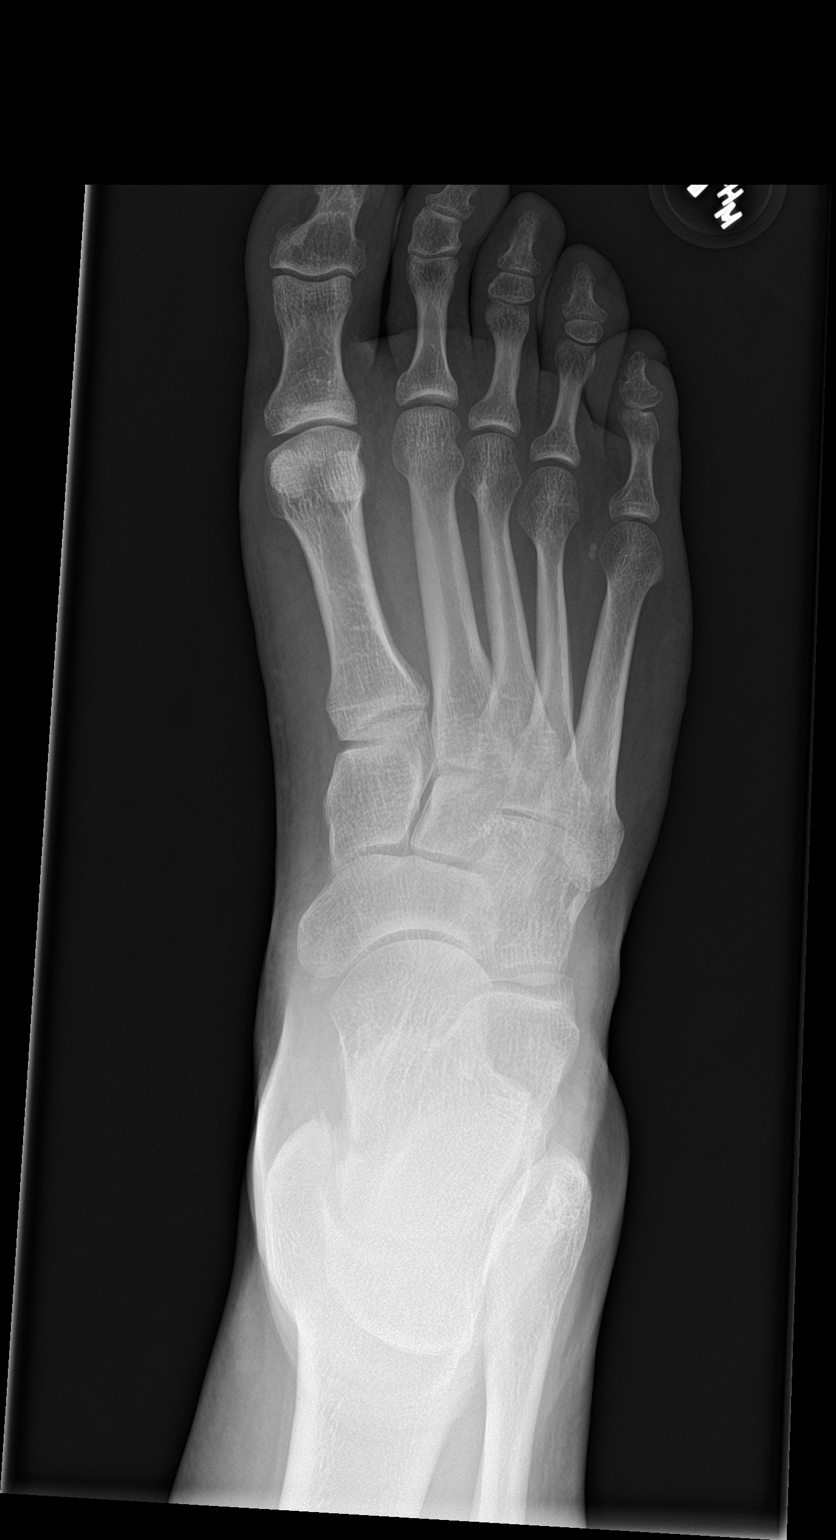

[foot obl]
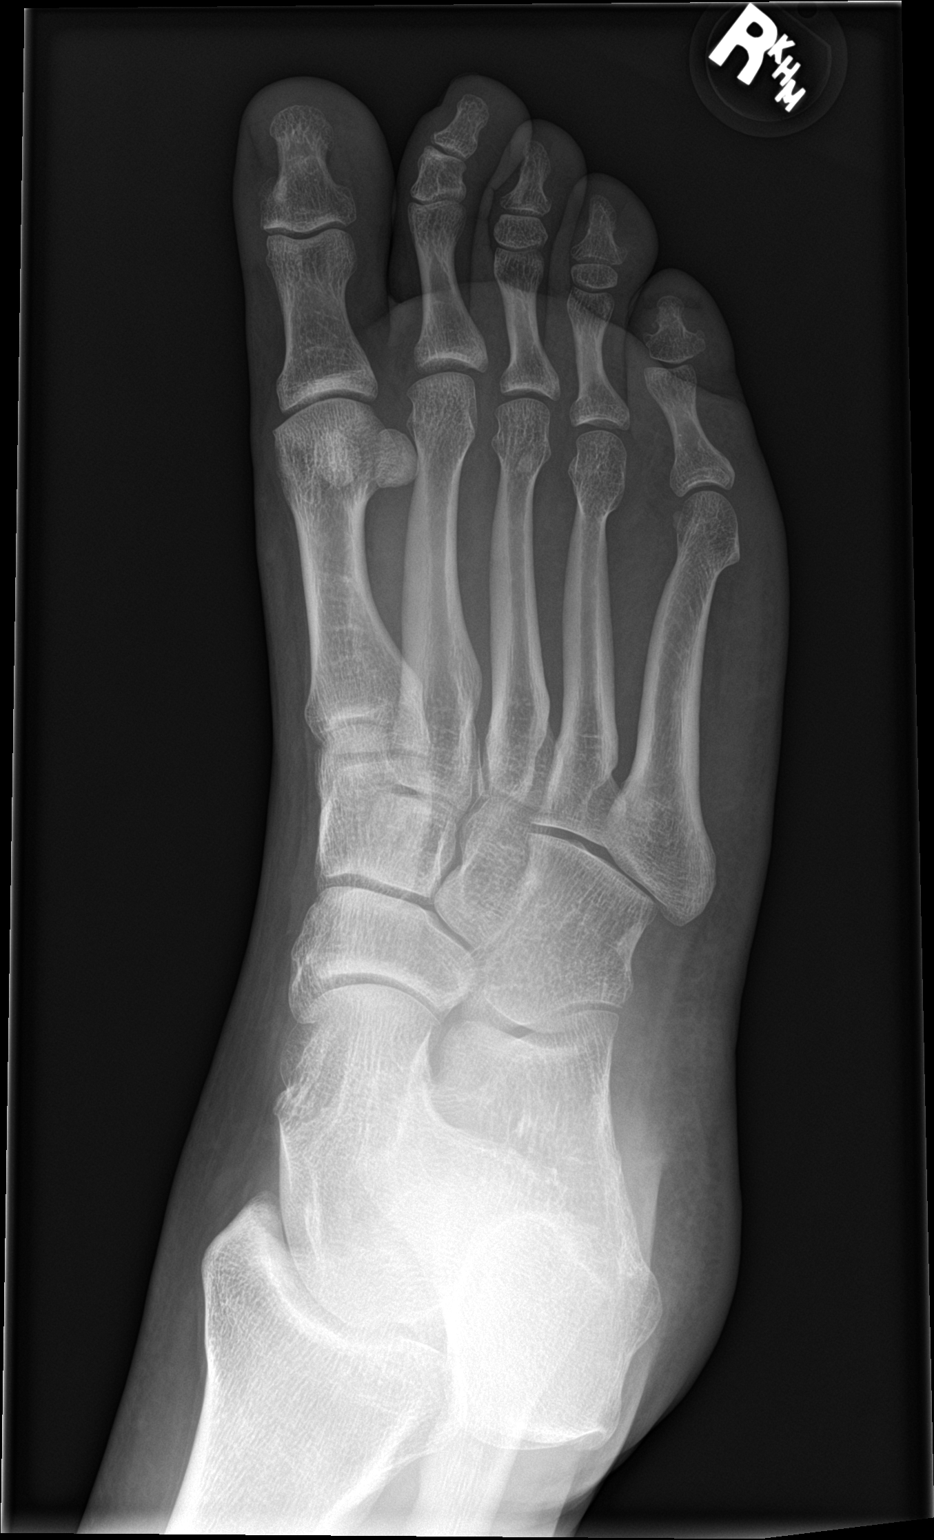

[foot lat]
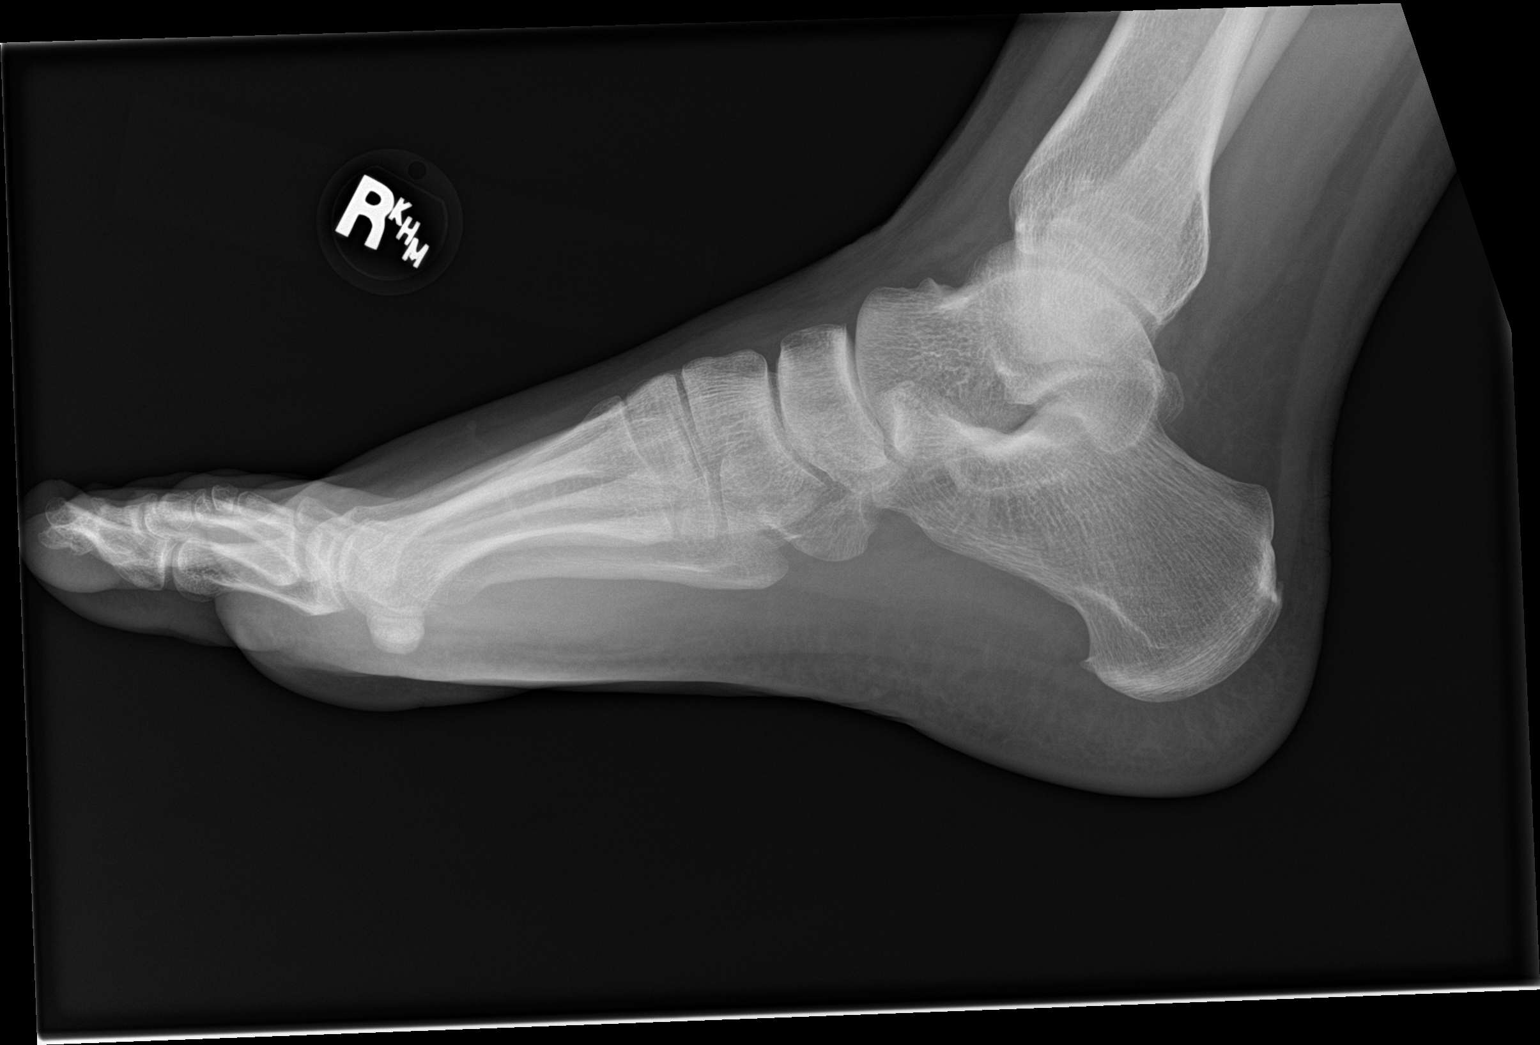

[3 of 3 positions shown; findings below may reference images not displayed]

FINDINGS: No acute fracture identified. Mild soft tissue swelling overlying
the fifth digit of the right foot. No radiopaque foreign body. No
subcutaneous emphysema.
IMPRESSION: No acute bony abnormality.

Soft tissue swelling overlying the fifth digit of the right foot.

## 2016-11-09 ENCOUNTER — Telehealth: Payer: Self-pay | Admitting: Family Medicine

## 2016-11-09 ENCOUNTER — Encounter: Payer: Self-pay | Admitting: Family Medicine

## 2016-11-09 ENCOUNTER — Ambulatory Visit: Payer: 59 | Admitting: Family Medicine

## 2016-11-09 VITALS — BP 122/78 | Ht 74.0 in | Wt 321.0 lb

## 2016-11-09 DIAGNOSIS — N5201 Erectile dysfunction due to arterial insufficiency: Secondary | ICD-10-CM

## 2016-11-09 DIAGNOSIS — F411 Generalized anxiety disorder: Secondary | ICD-10-CM

## 2016-11-09 MED ORDER — SILDENAFIL CITRATE 20 MG PO TABS: ORAL_TABLET | ORAL | 5 refills | Status: DC

## 2016-11-09 MED ORDER — ALPRAZOLAM 1 MG PO TABS: ORAL_TABLET | ORAL | 2 refills | Status: DC

## 2016-11-09 NOTE — Telephone Encounter (Signed)
Please advise. Thanks.  

## 2016-11-09 NOTE — Telephone Encounter (Signed)
Pt's at the pharmacy now, would like us to send this in while he's there  (wants this done today if at all possible)  Pharmacist said if we order 100mg  of the Sildenafil then pt's insurance would pay  (the 20mg  will require prior auth)  Can we change strength?  Please call pt when done  [facility]/[place]

## 2016-11-09 NOTE — Telephone Encounter (Signed)
100 mg sildenafil one half two hrs prior to sex numb 8 plus five ref

## 2016-11-09 NOTE — Progress Notes (Signed)
   Subjective:    Patient ID: Vincent Ponce, male    DOB: [birth year], 31 y.o.   MRN: [id number]  Depression         This is a new problem.  The current episode started more than 1 year ago. ( anxious)     The symptoms are aggravated by family issues.  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  States does not really feel depressed.  Positive substantial history of depression feels he is having more difficulty with anxiety.  In the past benefit from anxiolytic medication.  Really does not want to take any daily neurotransmitter effect to medication   Also notes substantial problem with erections.  Has increased with recent bout of anxiety and stress.  Would like to try some medication for this. Patient states no other concerns this visit.   Review of Systems  Psychiatric/Behavioral: Positive for depression.       Objective:   Physical Exam  Alert and oriented, vitals reviewed and stable, NAD ENT-TM's and ext canals WNL bilat via otoscopic exam Soft palate, tonsils and post pharynx WNL via oropharyngeal exam Neck-symmetric, no masses; thyroid nonpalpable and nontender Pulmonary-no tachypnea or accessory muscle use; Clear without wheezes via auscultation Card--no abnrml murmurs, rhythm reg and rate WNL Carotid pulses symmetric, without bruits       Assessment & Plan:  Impression 1 generalized anxiety disorder.  Discussed at length.  Will prescribe anxiolytic medication.  Proper use discussed exercise.  2.  Erectile dysfunction.  Various interventions discussed  patient would like to try generic Viagra.  This is most benefits use discussed  Greater than 50% of this 25 minute face to face visit was spent in counseling and discussion and coordination of care regarding the above diagnosis/diagnosies

## 2016-11-10 MED ORDER — SILDENAFIL CITRATE 100 MG PO TABS: ORAL_TABLET | ORAL | 5 refills | Status: DC

## 2016-11-10 NOTE — Telephone Encounter (Signed)
I called and and left a vm. We have sent in the new rx.

## 2016-11-12 NOTE — Addendum Note (Signed)
Addended by: Merlyn AlbertLUKING, Maurion Walkowiak S on: 11/12/2016 09:07 AM   Modules accepted: Orders

## 2016-11-20 ENCOUNTER — Encounter: Payer: Self-pay | Admitting: Family Medicine

## 2016-11-28 ENCOUNTER — Encounter: Payer: Self-pay | Admitting: Family Medicine

## 2017-01-29 ENCOUNTER — Ambulatory Visit: Payer: 59 | Admitting: Family Medicine

## 2017-02-15 ENCOUNTER — Encounter: Payer: Self-pay | Admitting: Family Medicine

## 2018-02-19 ENCOUNTER — Ambulatory Visit (INDEPENDENT_AMBULATORY_CARE_PROVIDER_SITE_OTHER): Payer: BLUE CROSS/BLUE SHIELD | Admitting: Family Medicine

## 2018-02-19 ENCOUNTER — Encounter: Payer: Self-pay | Admitting: Family Medicine

## 2018-02-19 ENCOUNTER — Telehealth: Payer: Self-pay | Admitting: Family Medicine

## 2018-02-19 VITALS — BP 134/72 | Temp 99.0°F | Ht 74.0 in | Wt 321.2 lb

## 2018-02-19 DIAGNOSIS — F5101 Primary insomnia: Secondary | ICD-10-CM

## 2018-02-19 DIAGNOSIS — F411 Generalized anxiety disorder: Secondary | ICD-10-CM

## 2018-02-19 DIAGNOSIS — K2100 Gastro-esophageal reflux disease with esophagitis, without bleeding: Secondary | ICD-10-CM

## 2018-02-19 DIAGNOSIS — J111 Influenza due to unidentified influenza virus with other respiratory manifestations: Secondary | ICD-10-CM

## 2018-02-19 MED ORDER — CLONAZEPAM 1 MG PO TABS: ORAL_TABLET | ORAL | 0 refills | Status: DC

## 2018-02-19 MED ORDER — OSELTAMIVIR PHOSPHATE 75 MG PO CAPS: 75.0000 mg | ORAL_CAPSULE | Freq: Two times a day (BID) | ORAL | 0 refills | Status: AC

## 2018-02-19 MED ORDER — BENZONATATE 100 MG PO CAPS: 100.0000 mg | ORAL_CAPSULE | Freq: Three times a day (TID) | ORAL | 0 refills | Status: DC | PRN

## 2018-02-19 MED ORDER — TRIAMCINOLONE ACETONIDE 0.1 % EX CREA: 1.0000 "application " | TOPICAL_CREAM | Freq: Two times a day (BID) | CUTANEOUS | 2 refills | Status: DC

## 2018-02-19 MED ORDER — PANTOPRAZOLE SODIUM 40 MG PO TBEC: 40.0000 mg | DELAYED_RELEASE_TABLET | Freq: Every day | ORAL | 2 refills | Status: DC

## 2018-02-19 NOTE — Telephone Encounter (Signed)
rx faxed and pt notified.

## 2018-02-19 NOTE — Telephone Encounter (Signed)
Patient checked on status of clonazePAM (KLONOPIN) 1 MG tablet , pt aware medication awaiting signature before sent to pharmacy, pt would like to be notified when medication has been sent in.   Pharmacy:  [facility] [phone] - [place], [place] - [address] AT [facility] [address]

## 2018-02-19 NOTE — Progress Notes (Signed)
   Subjective:    Patient ID: Vincent Ponce, male    DOB: 06-Aug-1985, 33 y.o.   MRN: [id number]  Cough  This is a new problem. Episode onset: 2 days. Associated symptoms include a fever and headaches. Associated symptoms comments: Vomiting, congestion.   Would like to get a refill on xanax. Pt states he has been out of med for months. Has had some things happen that caused anxiety. Itching arms at night to the point it is causing sores on arms.   Review of Systems  Constitutional: Positive for fever.  Respiratory: Positive for cough.   Neurological: Positive for headaches.       Objective:   Physical Exam        Assessment & Plan:

## 2018-02-19 NOTE — Progress Notes (Signed)
   Subjective:    Patient ID: Vincent Ponce, male    DOB: [birth year], 33 y.o.   MRN: [id number]  HPI Patient has had some congestion has had a rather acute onset of headache accompanied by cough diminished energy and achiness   Patient also reports a considerable amount of stress.  Recently turned in his 2-week notice.  States not suicidal.  Moving  within the next few weeks to stay with his mom.  Having trouble sleeping at night.  He is apparent and works with the [place] but feels he has not received.  Management of his stress through the [place].  [facility] when he gets [facility] the [facility]  Patient also notes considerable amount of reflux.  He has some substantial symptomatology.  Would like to take something to help  Review of Systems No headache, no major weight loss or weight gain, no chest pain no back pain abdominal pain no change in bowel habits complete ROS otherwise negative     Objective:   Physical Exam   Alert slightly anxious appearing however no acute distress, vitals reviewed, moderate malaise. Hydration good. Positive nasal congestion lungs no crackles or wheezes, no tachypnea, intermittent bronchial cough during exam heart regular rate and rhythm. Abdominal exam no discrete tenderness no rebound no guarding no masses.  Somewhat overweight     Assessment & Plan:  I  Impression influenza discussed at length. Ashby Dawes of illness and potential sequela discussed. Plan Tamiflu prescribed if indicated and timing appropriate. Symptom care discussed. Warning signs discussed. WSL  Insomnia Also nightly Klonopin given to use over the next [place]  Proper use discussed.  Side effects benefits discussed.  Warning signs discussed for that along with other mental health issues discussed..  Reflux esophagitis.  Symptom care discussed.  Proton pump inhibitor also prescribed for reflux symptomatology.

## 2019-02-03 ENCOUNTER — Encounter: Payer: Self-pay | Admitting: Family Medicine

## 2019-09-22 ENCOUNTER — Ambulatory Visit: Payer: BC Managed Care – PPO | Admitting: Family Medicine

## 2019-09-22 ENCOUNTER — Encounter: Payer: Self-pay | Admitting: Family Medicine

## 2019-09-22 ENCOUNTER — Other Ambulatory Visit: Payer: Self-pay

## 2019-09-22 VITALS — BP 118/64 | HR 82 | Temp 98.0°F | Ht 74.0 in | Wt 292.6 lb

## 2019-09-22 DIAGNOSIS — Z8659 Personal history of other mental and behavioral disorders: Secondary | ICD-10-CM

## 2019-09-22 DIAGNOSIS — R41840 Attention and concentration deficit: Secondary | ICD-10-CM | POA: Diagnosis not present

## 2019-09-22 NOTE — Progress Notes (Signed)
Patient ID: Vincent Ponce, male    DOB: 07-25-1985, 34 y.o.   MRN: [id number]   Chief Complaint  Patient presents with  . Follow-up    inattention, h/o adhd.   Subjective:    HPI   Pt here for follow up on adhd/inattention. Pt stating he has been on ADD med on and off most his life but has not been on any in a while. Having trouble focusing and listening to what people say and remembering information.   Per pt stating he was diagnosed when he was a child.  And pt stating was on/off medication over the years. Pt in new relationship and noticing problems at work. Depression- pt stating he was on meds till 2 months ago- stopped wellbutrin. Was helping with stopping smoking. Thought was helping with depression.  However, pt stopped it and decided he didn't  wanting to be on anything if he didn't have to. 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  Forgetful at times. Mind jumping at times. Hard to focus on task.  Pt went [place] neuropsych testing [place]. And had extensive testing done for add. Pt took ritalin when younger, then adderall as adult. Goes to the [place], however, didn't make appt with them for this.  Medical History Sidhant has a past medical history of ADD (attention deficit disorder), Alcoholism (HCC), Depression, Heat stroke, PTSD (post-traumatic stress disorder), Rhabdomyolysis, and Sleep apnea.   Outpatient Encounter Medications as of 09/22/2019  Medication Sig  . [DISCONTINUED] ALPRAZolam (XANAX) 1 MG tablet Take 0.5-1 tablet by mouth daily as needed for anxiety/insomnia.  . [DISCONTINUED] benzonatate (TESSALON) 100 MG capsule Take 1 capsule (100 mg total) by mouth 3 (three) times daily as needed for cough.  . [DISCONTINUED] clonazePAM (KLONOPIN) 1 MG tablet One Qhs prn sleep  . [DISCONTINUED] pantoprazole (PROTONIX) 40 MG tablet Take 1 tablet (40 mg total) by mouth daily.  .  [DISCONTINUED] sildenafil (REVATIO) 20 MG tablet Two or three p o two hrs prior to sex  . [DISCONTINUED] sildenafil (VIAGRA) 100 MG tablet One half two hour prior to sex  . [DISCONTINUED] triamcinolone cream (KENALOG) 0.1 % Apply 1 application topically 2 (two) times daily.   No facility-administered encounter medications on file as of 09/22/2019.     Review of Systems  Constitutional: Negative for chills and fever.  HENT: Negative for congestion, rhinorrhea and sore throat.   Respiratory: Negative for cough, shortness of breath and wheezing.   Cardiovascular: Negative for chest pain and leg swelling.  Gastrointestinal: Negative for abdominal pain, diarrhea, nausea and vomiting.  Genitourinary: Negative for dysuria and frequency.  Skin: Negative for rash.  Neurological: Negative for dizziness, weakness and headaches.  Psychiatric/Behavioral: Positive for decreased concentration. Negative for agitation, behavioral problems, confusion, dysphoric mood, hallucinations, self-injury, sleep disturbance and suicidal ideas. The patient is not nervous/anxious and is not hyperactive.      Vitals BP 118/64   Pulse 82   Temp 98 F (36.7 C)   Ht 6\' 2"  (1.88 m)   Wt 292 lb 9.6 oz (132.7 kg)   SpO2 98%   BMI 37.57 kg/m   Objective:   Physical Exam Vitals and nursing note reviewed.  Constitutional:      General: He is not in acute distress.    Appearance: Normal appearance. He is not ill-appearing.  Cardiovascular:     Rate and Rhythm: Normal rate  and regular rhythm.  Pulmonary:     Effort: Pulmonary effort is normal. No respiratory distress.  Musculoskeletal:        General: Normal range of motion.  Skin:    General: Skin is warm and dry.     Findings: No rash.  Neurological:     General: No focal deficit present.     Mental Status: He is alert and oriented to person, place, and time.     Cranial Nerves: No cranial nerve deficit.  Psychiatric:        Mood and Affect: Mood normal.         Behavior: Behavior normal.        Thought Content: Thought content normal.        Judgment: Judgment normal.      Assessment and Plan   1. Inattention - Ambulatory referral to Psychiatry  2. History of ADHD - Ambulatory referral to Psychiatry   Offered pt to restart wellbutrin or Straterra.  Pt declining.  Pt would rather be on adderall or stimulant. Discussed better for him to have neuropsych testing and tease out anxiety/adhd symptoms. Pt stating he would take referral to psychiatry.  F/u prn.

## 2019-10-20 ENCOUNTER — Telehealth: Payer: Self-pay | Admitting: *Deleted

## 2019-10-20 MED ORDER — BUPROPION HCL ER (SR) 150 MG PO TB12: ORAL_TABLET | ORAL | 0 refills | Status: DC

## 2019-10-20 NOTE — Telephone Encounter (Signed)
Pt seen on 09/22/19 and states at that visit dr taylor talked with him about being put on wellbutrin and at that time pt did not want to but states he has thought about it and now does want to try the medication.   [facility] [address]

## 2019-10-20 NOTE — Telephone Encounter (Signed)
Pt states he wanted to see if he could get extended release so he would only have to take once a daily.

## 2019-10-21 ENCOUNTER — Other Ambulatory Visit: Payer: Self-pay | Admitting: *Deleted

## 2019-10-21 MED ORDER — BUPROPION HCL ER (XL) 150 MG PO TB24: ORAL_TABLET | ORAL | 0 refills | Status: DC

## 2019-10-21 NOTE — Telephone Encounter (Signed)
Discussed with pt. Pt verbalized understanding.  °

## 2019-11-18 ENCOUNTER — Ambulatory Visit: Payer: BC Managed Care – PPO | Admitting: Family Medicine

## 2019-11-25 ENCOUNTER — Telehealth: Payer: Self-pay

## 2019-11-25 MED ORDER — BUPROPION HCL ER (XL) 300 MG PO TB24: ORAL_TABLET | ORAL | 0 refills | Status: DC

## 2019-11-25 NOTE — Telephone Encounter (Signed)
Patient is requesting refill on bupropion 150 mg will be out this week has appointment on 12/1 just enough until he seen. [facility] [address]

## 2019-11-26 ENCOUNTER — Ambulatory Visit: Payer: 59 | Admitting: Family Medicine

## 2019-11-26 NOTE — Telephone Encounter (Signed)
Patient notified

## 2019-12-03 ENCOUNTER — Ambulatory Visit: Payer: 59 | Admitting: Family Medicine

## 2020-03-08 ENCOUNTER — Telehealth: Payer: Self-pay | Admitting: Family Medicine

## 2020-03-08 DIAGNOSIS — Z0000 Encounter for general adult medical examination without abnormal findings: Secondary | ICD-10-CM

## 2020-03-08 DIAGNOSIS — Z79899 Other long term (current) drug therapy: Secondary | ICD-10-CM

## 2020-03-08 DIAGNOSIS — Z13220 Encounter for screening for lipoid disorders: Secondary | ICD-10-CM

## 2020-03-08 NOTE — Telephone Encounter (Signed)
Patient has appointment on 3/22 for 6 month follow up and needing labs done

## 2020-03-08 NOTE — Telephone Encounter (Signed)
Last labs completed 09/09/2012 CK, Sed Rate, Hepatic, CBC and BMET. Please advise. Thank you

## 2020-03-09 NOTE — Telephone Encounter (Signed)
Pls order lipid screen and cmp.   Thx.   Dr. Ladona Ridgel

## 2020-03-09 NOTE — Telephone Encounter (Signed)
Lab orders placed and pt is aware 

## 2020-03-09 NOTE — Telephone Encounter (Signed)
Regular routine labs for 35 yr old male is cbc, cmp, lipid panel.   Testosterone is ordered if there is a concern.  Not a routine lab.  We can discuss further at appt if needing other labs.    Dr. Ladona Ridgel

## 2020-03-09 NOTE — Telephone Encounter (Signed)
Pt contacted. Pt would like complete work up. Pt would like enzymes checked and testosterone level. Pt states he has not had good lab work since he has gotten older. Please advise. Thank you

## 2020-03-22 ENCOUNTER — Ambulatory Visit: Payer: 59 | Admitting: Family Medicine

## 2020-03-22 ENCOUNTER — Encounter: Payer: Self-pay | Admitting: Family Medicine

## 2022-02-21 DIAGNOSIS — I10 Essential (primary) hypertension: Secondary | ICD-10-CM | POA: Insufficient documentation

## 2022-02-21 DIAGNOSIS — R519 Headache, unspecified: Secondary | ICD-10-CM | POA: Diagnosis present

## 2022-02-22 ENCOUNTER — Other Ambulatory Visit: Payer: Self-pay

## 2022-02-22 ENCOUNTER — Encounter (HOSPITAL_COMMUNITY): Payer: Self-pay | Admitting: Emergency Medicine

## 2022-02-22 ENCOUNTER — Emergency Department (HOSPITAL_COMMUNITY)
Admission: EM | Admit: 2022-02-22 | Discharge: 2022-02-22 | Disposition: A | Discharge status: Home / Self Care | Payer: No Typology Code available for payment source | Attending: Emergency Medicine | Admitting: Emergency Medicine

## 2022-02-22 DIAGNOSIS — I10 Essential (primary) hypertension: Secondary | ICD-10-CM

## 2022-02-22 DIAGNOSIS — R519 Headache, unspecified: Secondary | ICD-10-CM

## 2022-02-22 NOTE — ED Provider Notes (Signed)
[facility] Provider Note   CSN: [id number] Arrival date & time: 02/21/22  2346     History  Chief Complaint  Patient presents with   Hypertension   Headache    Vincent Ponce is a 37 y.o. male.  37 year old male who presents ER today secondary to hypertension and headache.  States that he started testosterone about 8 weeks ago without any issues.  Today was noticing a fullness in his head.  He narrated the night this recently so he checked his blood pressure since he has started testosterone he knew his blood pressure could be high.  States his blood pressure was initially little bit elevated at 170s and is bothered him his headache got worse we rechecked in about 10 minutes it was in the 180s, he thinks is 187/90.  No vision changes, numbness, tingling, weakness, chest pain, shortness of breath or edema.  Also states that he bought some Diabol off the Internet.  Wonders if this might be related.  Is been on this for 2 to 3 weeks.  At this time his blood pressure is improved and his headache is improved.  He has no symptoms at this time.   Hypertension Associated symptoms include headaches.  Headache      Home Medications Prior to Admission medications   Medication Sig Start Date End Date Taking? Authorizing Provider  buPROPion (WELLBUTRIN XL) 300 MG 24 hr tablet Take one tablet p.o. daily 11/25/19   Erven Colla, DO      Allergies    Poison sumac extract    Review of Systems   Review of Systems  Neurological:  Positive for headaches.    Physical Exam Updated Vital Signs BP (!) 140/81   Pulse 78   Temp 98.3 F (36.8 C) (Oral)   Resp 19   Ht 6' 2"$  (1.88 m)   Wt 127 kg   SpO2 98%   BMI 35.95 kg/m  Physical Exam Vitals and nursing note reviewed.  Constitutional:      Appearance: He is well-developed.  HENT:     Head: Normocephalic and atraumatic.  Cardiovascular:     Rate and Rhythm: Normal rate.   Pulmonary:     Effort: Pulmonary effort is normal. No respiratory distress.  Abdominal:     General: There is no distension.  Musculoskeletal:        General: Normal range of motion.     Cervical back: Normal range of motion.  Neurological:     Mental Status: He is alert.     Comments: No altered mental status, able to give full seemingly accurate history.  Face is symmetric, EOM's intact, pupils equal and reactive, vision intact, tongue and uvula midline without deviation. Upper and Lower extremity motor 5/5, intact pain perception in distal extremities, 2+ reflexes in biceps, patella and achilles tendons. Able to perform finger to nose normal with both hands. Walks without assistance or evident ataxia.       ED Results / Procedures / Treatments   Labs (all labs ordered are listed, but only abnormal results are displayed) Labs Reviewed - No data to display  EKG None  Radiology No results found.  Procedures Procedures    Medications Ordered in ED Medications - No data to display  ED Course/ Medical Decision Making/ A&P  Medical Decision Making  37 year old male here with headache likely related to hypertension.  This could be related to testosterone or possibly what ever this medicine he gets off the Internet could have in it.  No signs of acute endorgan damage related to his blood pressure.  Low suspicion for intracranial bleed since everything seems to have improved here.  Not sure if the headache because his blood pressure to go up his blood pressure because headache in the first place we will keep a blood pressure log and follow-up with PCP in about a week to see if any further management is needed.  Final Clinical Impression(s) / ED Diagnoses Final diagnoses:  Hypertension, unspecified type  Nonintractable headache, unspecified chronicity pattern, unspecified headache type    Rx / DC Orders ED Discharge Orders     None          Damarys Speir, Corene Cornea, MD 02/22/22 2319

## 2022-02-22 NOTE — ED Triage Notes (Signed)
Pt here for c/o Headache and HTN. States he checked BP @ home and reading was 184/97. C/o headache "all day". States has been on Testerone x 4 months.

## 2022-06-19 ENCOUNTER — Ambulatory Visit
Admission: EM | Admit: 2022-06-19 | Discharge: 2022-06-19 | Disposition: A | Discharge status: Home / Self Care | Payer: No Typology Code available for payment source

## 2022-06-19 DIAGNOSIS — Z23 Encounter for immunization: Secondary | ICD-10-CM | POA: Diagnosis not present

## 2022-06-19 DIAGNOSIS — S91332A Puncture wound without foreign body, left foot, initial encounter: Secondary | ICD-10-CM

## 2022-06-19 MED ORDER — AMOXICILLIN-POT CLAVULANATE 875-125 MG PO TABS: 1.0000 | ORAL_TABLET | Freq: Two times a day (BID) | ORAL | 0 refills | Status: AC

## 2022-06-19 MED ORDER — TETANUS-DIPHTH-ACELL PERTUSSIS 5-2.5-18.5 LF-MCG/0.5 IM SUSY
0.5000 mL | PREFILLED_SYRINGE | Freq: Once | INTRAMUSCULAR | Status: AC
  Administered 2022-06-19: 0.5 mL via INTRAMUSCULAR

## 2022-06-19 NOTE — ED Provider Notes (Signed)
[facility]    CSN: [id number] Arrival date & time: 06/19/22  1829      History   Chief Complaint No chief complaint on file.   HPI Vincent Ponce is a 37 y.o. male.   The history is provided by the patient.   The patient presents after he stepped on a piece of wire while at work.  Patient states he was wearing a pair of boots, and he stepped on a piece of wire that was approximately 2 inches long.  He states approximately half of the wire went through the bottom of his boots.  He states that he did remove the wire after the injury occurred.  He denies fever, chills, redness, swelling, bleeding, numbness, tingling, or erythema.  He is unsure of his last tetanus shot.  Past Medical History:  Diagnosis Date   ADD (attention deficit disorder)    Alcoholism (HCC)    1 year ago.   Depression    Heat stroke    PTSD (post-traumatic stress disorder)    Rhabdomyolysis    Sleep apnea     Patient Active Problem List   Diagnosis Date Noted   History of rhabdomyolysis 09/09/2012    Past Surgical History:  Procedure Laterality Date   KNEE SURGERY     MUSCLE BIOPSY         Home Medications    Prior to Admission medications   Medication Sig Start Date End Date Taking? Authorizing Provider  amoxicillin-clavulanate (AUGMENTIN) 875-125 MG tablet Take 1 tablet by mouth every 12 (twelve) hours for 5 days. 06/19/22 06/24/22 Yes Jailynne Opperman-Warren, Sadie Haber, NP  anastrozole (ARIMIDEX) 1 MG tablet Take by mouth. 09/23/21  Yes [provider]  tadalafil (CIALIS) 5 MG tablet Take 5 mg by mouth daily. 06/07/22  Yes [provider]  testosterone cypionate (DEPOTESTOSTERONE CYPIONATE) 200 MG/ML injection SMARTSIG:0.4 Milliliter(s) IM Once a Week 06/16/22  Yes [provider]  buPROPion (WELLBUTRIN XL) 300 MG 24 hr tablet Take one tablet p.o. daily 11/25/19   [name], DO    Family History Family History  Problem Relation Age of Onset   Cancer  Mother    Cancer Maternal Grandmother    Cancer Maternal Grandfather     Social History Social History   Tobacco Use   Smoking status: Former   Smokeless tobacco: Current    Types: Snuff  Substance Use Topics   Alcohol use: Yes   Drug use: No     Allergies   Poison sumac extract   Review of Systems Review of Systems Per HPI  Physical Exam Triage Vital Signs ED Triage Vitals  Enc Vitals Group     BP 06/19/22 1847 (!) 150/78     Pulse Rate 06/19/22 1847 74     Resp 06/19/22 1847 18     Temp 06/19/22 1847 97.9 F (36.6 C)     Temp Source 06/19/22 1847 Oral     SpO2 06/19/22 1847 97 %     Weight --      Height --      Head Circumference --      Peak Flow --      Pain Score 06/19/22 1849 3     Pain Loc --      Pain Edu? --      Excl. in GC? --    No data found.  Updated Vital Signs BP (!) 150/78 (BP Location: Right Arm)   Pulse 74   Temp 97.9  F (36.6 C) (Oral)   Resp 18   SpO2 97%   Visual Acuity Right Eye Distance:   Left Eye Distance:   Bilateral Distance:    Right Eye Near:   Left Eye Near:    Bilateral Near:     Physical Exam Vitals and nursing note reviewed.  Constitutional:      General: He is not in acute distress.    Appearance: Normal appearance.  HENT:     Head: Normocephalic.  Eyes:     Extraocular Movements: Extraocular movements intact.     Pupils: Pupils are equal, round, and reactive to light.  Pulmonary:     Effort: Pulmonary effort is normal.  Feet:     Comments: Pinpoint puncture wound noted to the bottom of the left foot at the fifth metatarsal.  Area is tender to palpation.  There is no oozing, erythema, warmth, fluctuance, or drainage present. Skin:    General: Skin is warm and dry.  Neurological:     General: No focal deficit present.     Mental Status: He is alert and oriented to person, place, and time.  Psychiatric:        Mood and Affect: Mood normal.        Behavior: Behavior normal.      UC Treatments /  Results  Labs (all labs ordered are listed, but only abnormal results are displayed) Labs Reviewed - No data to display  EKG   Radiology No results found.  Procedures Procedures (including critical care time)  Medications Ordered in UC Medications  Tdap (BOOSTRIX) injection 0.5 mL (0.5 mLs Intramuscular Given 06/19/22 1937)    Initial Impression / Assessment and Plan / UC Course  I have reviewed the triage vital signs and the nursing notes.  Pertinent labs & imaging results that were available during my care of the patient were reviewed by me and considered in my medical decision making (see chart for details).  The patient is well-appearing, he is in no acute distress, vital signs are stable.  Pinpoint puncture wound noted to the left foot under the fifth metatarsal.  There is no bleeding, swelling, or erythema present.  The area is tender to palpation.  Tdap was updated today.  Will start patient on Augmentin 875/125 mg tablets for antibiotic prophylaxis.  Supportive care recommendations were provided and discussed with the patient to include over-the-counter analgesics for pain or discomfort, ice to the affected area, and warm Epsom salt soaks.  Patient was given strict follow-up precautions.  Patient was in agreement with this plan of care and verbalizes understanding.  All questions were answered.  Patient stable for discharge.  Work note was provided.  Final Clinical Impressions(s) / UC Diagnoses   Final diagnoses:  Puncture wound of left foot, initial encounter     Discharge Instructions      Your Tdap has been updated today.  It is good for the next 10 years. Take medication as prescribed. May take over-the-counter Tylenol or ibuprofen as needed for pain, fever, general discomfort. Apply ice to the bottom of the foot to help with pain or discomfort. Also recommend warm Epsom salt soaks as needed for pain, swelling, or general discomfort. You may need to wear 2 pair  socks to provide padding to the bottom of the foot while symptoms persist. Recommend wearing shoes with a thick, hard insole to prevent reinjury. Monitor the area for signs of infection to include increased redness, foul-smelling drainage, or if you experience fever,  chills, or other concerns.  If you experience any of the symptoms, please follow-up in this clinic or in the emergency department for further evaluation. Follow-up as needed.     ED Prescriptions     Medication Sig Dispense Auth. Provider   amoxicillin-clavulanate (AUGMENTIN) 875-125 MG tablet Take 1 tablet by mouth every 12 (twelve) hours for 5 days. 10 tablet Christyan Reger-Warren, Sadie Haber, NP      PDMP not reviewed this encounter.   Abran Cantor, NP 06/19/22 2005

## 2022-06-19 NOTE — Discharge Instructions (Signed)
Your Tdap has been updated today.  It is good for the next 10 years. Take medication as prescribed. May take over-the-counter Tylenol or ibuprofen as needed for pain, fever, general discomfort. Apply ice to the bottom of the foot to help with pain or discomfort. Also recommend warm Epsom salt soaks as needed for pain, swelling, or general discomfort. You may need to wear 2 pair socks to provide padding to the bottom of the foot while symptoms persist. Recommend wearing shoes with a thick, hard insole to prevent reinjury. Monitor the area for signs of infection to include increased redness, foul-smelling drainage, or if you experience fever, chills, or other concerns.  If you experience any of the symptoms, please follow-up in this clinic or in the emergency department for further evaluation. Follow-up as needed.

## 2022-06-19 NOTE — ED Triage Notes (Signed)
Pt reports he stepped on a wire at work today and it went deep inside of his left foot x 2 hrs ago. Pt states he pulled it out of his foot.   Last tetanus unknown but thinks it has been more than 10 years.

## 2023-05-16 ENCOUNTER — Emergency Department

## 2023-05-16 ENCOUNTER — Other Ambulatory Visit: Payer: Self-pay

## 2023-05-16 ENCOUNTER — Encounter (HOSPITAL_COMMUNITY): Payer: Self-pay

## 2023-05-16 ENCOUNTER — Emergency Department
Admission: EM | Admit: 2023-05-16 | Discharge: 2023-05-16 | Disposition: A | Discharge status: Home / Self Care | Attending: Emergency Medicine | Admitting: Emergency Medicine

## 2023-05-16 ENCOUNTER — Emergency Department (HOSPITAL_COMMUNITY)
Admission: EM | Admit: 2023-05-16 | Discharge: 2023-05-16 | Disposition: A | Discharge status: Home / Self Care | Attending: Emergency Medicine | Admitting: Emergency Medicine

## 2023-05-16 DIAGNOSIS — R0789 Other chest pain: Secondary | ICD-10-CM | POA: Diagnosis present

## 2023-05-16 DIAGNOSIS — I300 Acute nonspecific idiopathic pericarditis: Secondary | ICD-10-CM | POA: Insufficient documentation

## 2023-05-16 DIAGNOSIS — R079 Chest pain, unspecified: Secondary | ICD-10-CM | POA: Diagnosis present

## 2023-05-16 DIAGNOSIS — R748 Abnormal levels of other serum enzymes: Secondary | ICD-10-CM | POA: Insufficient documentation

## 2023-05-16 DIAGNOSIS — M6282 Rhabdomyolysis: Secondary | ICD-10-CM | POA: Insufficient documentation

## 2023-05-16 LAB — COMPREHENSIVE METABOLIC PANEL WITH GFR
ALT: 54 U/L — ABNORMAL HIGH (ref 0–44)
AST: 50 U/L — ABNORMAL HIGH (ref 15–41)
Albumin: 4.3 g/dL (ref 3.5–5.0)
Alkaline Phosphatase: 55 U/L (ref 38–126)
Anion gap: 5 (ref 5–15)
BUN: 29 mg/dL — ABNORMAL HIGH (ref 6–20)
CO2: 25 mmol/L (ref 22–32)
Calcium: 8.7 mg/dL — ABNORMAL LOW (ref 8.9–10.3)
Chloride: 107 mmol/L (ref 98–111)
Creatinine, Ser: 0.85 mg/dL (ref 0.61–1.24)
GFR, Estimated: >60 mL/min (ref >60)
Glucose, Bld: 106 mg/dL — ABNORMAL HIGH (ref 70–99)
Potassium: 3.9 mmol/L (ref 3.5–5.1)
Sodium: 137 mmol/L (ref 135–145)
Total Bilirubin: 0.7 mg/dL (ref 0.0–1.2)
Total Protein: 7.2 g/dL (ref 6.5–8.1)

## 2023-05-16 LAB — CBC WITH DIFFERENTIAL/PLATELET
Abs Immature Granulocytes: 0.02 10*3/uL (ref 0.00–0.07)
Basophils Absolute: 0.1 10*3/uL (ref 0.0–0.1)
Basophils Relative: 1 %
Eosinophils Absolute: 0.3 10*3/uL (ref 0.0–0.5)
Eosinophils Relative: 3 %
HCT: 49.1 % (ref 39.0–52.0)
Hemoglobin: 16.2 g/dL (ref 13.0–17.0)
Immature Granulocytes: 0 %
Lymphocytes Relative: 19 %
Lymphs Abs: 1.7 10*3/uL (ref 0.7–4.0)
MCH: 29.2 pg (ref 26.0–34.0)
MCHC: 33 g/dL (ref 30.0–36.0)
MCV: 88.5 fL (ref 80.0–100.0)
Monocytes Absolute: 1 10*3/uL (ref 0.1–1.0)
Monocytes Relative: 11 %
Neutro Abs: 6 10*3/uL (ref 1.7–7.7)
Neutrophils Relative %: 66 %
Platelets: 294 10*3/uL (ref 150–400)
RBC: 5.55 MIL/uL (ref 4.22–5.81)
RDW: 15.6 % — ABNORMAL HIGH (ref 11.5–15.5)
WBC: 9 10*3/uL (ref 4.0–10.5)
nRBC: 0 % (ref 0.0–0.2)

## 2023-05-16 LAB — URINALYSIS, ROUTINE W REFLEX MICROSCOPIC
Bilirubin Urine: NEGATIVE
Glucose, UA: NEGATIVE mg/dL
Hgb urine dipstick: NEGATIVE
Ketones, ur: NEGATIVE mg/dL
Leukocytes,Ua: NEGATIVE
Nitrite: NEGATIVE
Protein, ur: NEGATIVE mg/dL
Specific Gravity, Urine: 1.02 (ref 1.005–1.030)
pH: 5 (ref 5.0–8.0)

## 2023-05-16 LAB — TROPONIN I (HIGH SENSITIVITY)
Troponin I (High Sensitivity): 6 ng/L (ref <18)
Troponin I (High Sensitivity): 5 ng/L (ref <18)
Troponin I (High Sensitivity): 6 ng/L (ref <18)

## 2023-05-16 LAB — D-DIMER, QUANTITATIVE: D-Dimer, Quant: <0.27 ug{FEU}/mL (ref 0.00–0.50)

## 2023-05-16 LAB — CK
Total CK: 1119 U/L — ABNORMAL HIGH (ref 49–397)
Total CK: 870 U/L — ABNORMAL HIGH (ref 49–397)

## 2023-05-16 LAB — LIPASE, BLOOD: Lipase: 36 U/L (ref 11–51)

## 2023-05-16 MED ORDER — SODIUM CHLORIDE 0.9 % IV BOLUS
1000.0000 mL | Freq: Once | INTRAVENOUS | Status: AC
  Administered 2023-05-16: 1000 mL via INTRAVENOUS

## 2023-05-16 MED ORDER — DOXYCYCLINE HYCLATE 100 MG PO TABS
200.0000 mg | ORAL_TABLET | Freq: Once | ORAL | Status: AC
  Administered 2023-05-16: 200 mg via ORAL
  Filled 2023-05-16: qty 2

## 2023-05-16 MED ORDER — OMEPRAZOLE 20 MG PO CPDR: 20.0000 mg | DELAYED_RELEASE_CAPSULE | Freq: Two times a day (BID) | ORAL | 0 refills | Status: DC

## 2023-05-16 MED ORDER — ASPIRIN 81 MG PO CHEW
324.0000 mg | CHEWABLE_TABLET | Freq: Once | ORAL | Status: AC
  Administered 2023-05-16: 324 mg via ORAL
  Filled 2023-05-16: qty 4

## 2023-05-16 MED ORDER — IBUPROFEN 600 MG PO TABS
600.0000 mg | ORAL_TABLET | Freq: Four times a day (QID) | ORAL | 0 refills | Status: AC | PRN
Start: 2023-05-16 — End: ?

## 2023-05-16 MED ORDER — IBUPROFEN 800 MG PO TABS
800.0000 mg | ORAL_TABLET | Freq: Once | ORAL | Status: AC
  Administered 2023-05-16: 800 mg via ORAL
  Filled 2023-05-16: qty 1

## 2023-05-16 NOTE — ED Provider Notes (Signed)
 [place] EMERGENCY DEPARTMENT AT [facility] Provider Note   CSN: [id number] Arrival date & time: 05/16/23  1924     History {Add pertinent medical, surgical, social history, OB history to HPI:1} Chief Complaint  Patient presents with  . Chest Pain    Vincent Ponce is a 38 y.o. male.  HPI     38 year old male comes in with complaint of chest pain. Patient states that he went to work today, started having chest pain that was central and he went to [facility].  At [facility] he was told that he has normal troponins, slightly elevated CK which was clearing and he was discharged.  Patient however states that his chest pain never resolved.  Once he got home, he laid down and his pain got worse.  He also is noticing that the chest pain is worse with breathing.  Patient's chest pain improves when he leans forward.  Patient denies any heavy smoking, substance use.  He does admit to weekly testosterone injection and also taking anabolic steroids.  He has been injecting the testosterone and anabolic steroids for about 2 years now.  He has not had any complications from this.  Pt has no hx of PE, DVT.   Home Medications Prior to Admission medications   Medication Sig Start Date End Date Taking? Authorizing Provider  ibuprofen (ADVIL) 600 MG tablet Take 1 tablet (600 mg total) by mouth every 6 (six) hours as needed. 05/16/23  Yes Deatra Face, MD  omeprazole (PRILOSEC) 20 MG capsule Take 1 capsule (20 mg total) by mouth 2 (two) times daily before a meal. 05/16/23  Yes Brylynn Hanssen, MD  anastrozole (ARIMIDEX) 1 MG tablet Take by mouth. 09/23/21   [provider]  buPROPion  (WELLBUTRIN  XL) 300 MG 24 hr tablet Take one tablet p.o. daily 11/25/19   Carolynne Citron, Malena M, DO  tadalafil (CIALIS) 5 MG tablet Take 5 mg by mouth daily. 06/07/22   [provider]  testosterone cypionate (DEPOTESTOSTERONE CYPIONATE) 200 MG/ML injection SMARTSIG:0.4  Milliliter(s) IM Once a Week 06/16/22   [provider]      Allergies    Poison sumac extract    Review of Systems   Review of Systems  Physical Exam Updated Vital Signs BP (!) 151/82   Pulse 94   Temp 98.3 F (36.8 C) (Oral)   Resp 16   Ht 6\' 2"  (1.88 m)   Wt 113.4 kg   SpO2 96%   BMI 32.10 kg/m  Physical Exam  ED Results / Procedures / Treatments   Labs (all labs ordered are listed, but only abnormal results are displayed) Labs Reviewed  TROPONIN I (HIGH SENSITIVITY)    EKG EKG Interpretation Date/Time:  Wednesday May 16 2023 19:42:43 EDT Ventricular Rate:  94 PR Interval:  142 QRS Duration:  88 QT Interval:  335 QTC Calculation: 419 R Axis:   41  Text Interpretation: Sinus rhythm ST elev, probable normal early repol pattern No acute changes No significant change since last tracing Confirmed by Deatra Face ([id number]) on 05/16/2023 8:15:58 PM  Radiology DG Chest Port 1 View Result Date: 05/16/2023 CLINICAL DATA:  38 year old male with chest pain radiating to the shoulder acute onset 1 hour earlier. EXAM: PORTABLE CHEST 1 VIEW COMPARISON:  Chest radiographs 06/14/2007. FINDINGS: Portable AP upright view at 0544 hours. Stable low normal lung volumes. Normal cardiac size and mediastinal contours. Visualized tracheal air column is within normal limits. Allowing for portable technique the lungs are clear.  No pneumothorax or pleural effusion. Negative visible bowel gas and osseous structures. IMPRESSION: Negative portable chest. Electronically Signed   By: Marlise Simpers M.D.   On: 05/16/2023 05:55    Procedures Procedures  {Document cardiac monitor, telemetry assessment procedure when appropriate:1}  Medications Ordered in ED Medications  ibuprofen (ADVIL) tablet 800 mg (has no administration in time range)    ED Course/ Medical Decision Making/ A&P   {   Click here for ABCD2, HEART and other calculatorsREFRESH Note before signing :1}                               Medical Decision Making Risk Prescription drug management.   ***  {Document critical care time when appropriate:1} {Document review of labs and clinical decision tools ie heart score, Chads2Vasc2 etc:1}  {Document your independent review of radiology images, and any outside records:1} {Document your discussion with family members, caretakers, and with consultants:1} {Document social determinants of health affecting pt's care:1} {Document your decision making why or why not admission, treatments were needed:1} Final Clinical Impression(s) / ED Diagnoses Final diagnoses:  Acute idiopathic pericarditis    Rx / DC Orders ED Discharge Orders          Ordered    ibuprofen (ADVIL) 600 MG tablet  Every 6 hours PRN        05/16/23 2135    omeprazole (PRILOSEC) 20 MG capsule  2 times daily before meals        05/16/23 2135    Ambulatory referral to Cardiology       Comments: If you have not heard from the Cardiology office within the next 72 hours please call [phone].   05/16/23 2136

## 2023-05-16 NOTE — ED Provider Notes (Signed)
 Emergency department handoff note  Care of this patient was signed out to me at the end of the previous provider shift.  All pertinent patient information was conveyed and all questions were answered.  Patient pending repeat troponin and CK.  Troponin negative x 2.  CK downtrending. The patient has been reexamined and is ready to be discharged.  All diagnostic results have been reviewed and discussed with the patient/family.  Care plan has been outlined and the patient/family understands all current diagnoses, results, and treatment plans.  There are no new complaints, changes, or physical findings at this time.  All questions have been addressed and answered.  Patient was instructed to, and agrees to follow-up with their primary care physician as well as return to the emergency department if any new or worsening symptoms develop.   Kristiane Morsch K, MD 05/16/23 [phone]

## 2023-05-16 NOTE — ED Notes (Signed)
 CCMD called at this time to place pt on cardiac monitor.

## 2023-05-16 NOTE — ED Triage Notes (Signed)
 Pt to ED from home POV w/ right sided chest pain that radiates down the shoulder for roughly one hour. Pt denies SOB/N/V/recent illnesses.   Pt is A&O x4

## 2023-05-16 NOTE — ED Provider Notes (Signed)
 [facility] Provider Note    Event Date/Time   First MD Initiated Contact with Patient 05/16/23 [phone]     (approximate)   History   Chest Pain   HPI  Vincent Ponce is a 38 y.o. male who presents to the ED from home with a chief complaint of chest pain.  Patient was awake, getting ready for work when he experienced a dull ache to towards the left side of his chest radiating to his shoulder lasting approximately 1 hour.  Denies associated diaphoresis, shortness of breath, palpitations, nausea/vomiting or dizziness.  No history of CAD.  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  Denies recent fever/chills, abdominal pain or diarrhea.     Past Medical History   Past Medical History:  Diagnosis Date   ADD (attention deficit disorder)    Alcoholism (HCC)    1 year ago.   Depression    Heat stroke    PTSD (post-traumatic stress disorder)    Rhabdomyolysis    Sleep apnea      Active Problem List   Patient Active Problem List   Diagnosis Date Noted   History of rhabdomyolysis 09/09/2012     Past Surgical History   Past Surgical History:  Procedure Laterality Date   KNEE SURGERY     MUSCLE BIOPSY       Home Medications   Prior to Admission medications   Medication Sig Start Date End Date Taking? Authorizing Provider  anastrozole (ARIMIDEX) 1 MG tablet Take by mouth. 09/23/21   [provider]  buPROPion  (WELLBUTRIN  XL) 300 MG 24 hr tablet Take one tablet p.o. daily 11/25/19   Carolynne Citron, Malena M, DO  tadalafil (CIALIS) 5 MG tablet Take 5 mg by mouth daily. 06/07/22   [provider]  testosterone cypionate (DEPOTESTOSTERONE CYPIONATE) 200 MG/ML injection SMARTSIG:0.4 Milliliter(s) IM Once a Week 06/16/22   [provider]     Allergies  Poison sumac extract   Family History   Family History  Problem Relation Age of Onset   Cancer Mother    Cancer Maternal Grandmother    Cancer Maternal Grandfather       Physical Exam  Triage Vital Signs: ED Triage Vitals  Encounter Vitals Group     BP 05/16/23 0518 136/80     Systolic BP Percentile --      Diastolic BP Percentile --      Pulse Rate 05/16/23 0518 89     Resp 05/16/23 0518 (!) 24     Temp --      Temp src --      SpO2 05/16/23 0518 100 %     Weight --      Height --      Head Circumference --      Peak Flow --      Pain Score 05/16/23 0516 6     Pain Loc --      Pain Education --      Exclude from Growth Chart --     Updated Vital Signs: BP 125/77 (BP Location: Right Arm)   Pulse 70   Resp 17   SpO2 97%    General: Awake, no distress.  CV:  RRR.  Good peripheral perfusion.  Resp:  Normal effort.  CTAB. Abd:  Nontender.  No distention.  Other:  Bilateral calves are supple without tenderness.   ED Results / Procedures / Treatments  Labs (all labs ordered are listed, but only abnormal  results are displayed) Labs Reviewed  CBC WITH DIFFERENTIAL/PLATELET - Abnormal; Notable for the following components:      Result Value   RDW 15.6 (*)    All other components within normal limits  COMPREHENSIVE METABOLIC PANEL WITH GFR - Abnormal; Notable for the following components:   Glucose, Bld 106 (*)    BUN 29 (*)    Calcium 8.7 (*)    AST 50 (*)    ALT 54 (*)    All other components within normal limits  CK - Abnormal; Notable for the following components:   Total CK 1,119 (*)    All other components within normal limits  LIPASE, BLOOD  D-DIMER, QUANTITATIVE  URINALYSIS, ROUTINE W REFLEX MICROSCOPIC  CK  TROPONIN I (HIGH SENSITIVITY)  TROPONIN I (HIGH SENSITIVITY)     EKG  ED ECG REPORT I, Avel Ogawa J, the attending physician, personally viewed and interpreted this ECG.   Date: 05/16/2023  EKG Time: 0516  Rate: 82  Rhythm: normal sinus rhythm  Axis: Normal  Intervals: BER  ST&T Change: Nonspecific    RADIOLOGY I have independently visualized interpreted patient's imaging study as well as noted  the radiology interpretation:  Chest x-ray: No acute cardiopulmonary process  Official radiology report(s): DG Chest Port 1 View Result Date: 05/16/2023 CLINICAL DATA:  38 year old male with chest pain radiating to the shoulder acute onset 1 hour earlier. EXAM: PORTABLE CHEST 1 VIEW COMPARISON:  Chest radiographs 06/14/2007. FINDINGS: Portable AP upright view at 0544 hours. Stable low normal lung volumes. Normal cardiac size and mediastinal contours. Visualized tracheal air column is within normal limits. Allowing for portable technique the lungs are clear. No pneumothorax or pleural effusion. Negative visible bowel gas and osseous structures. IMPRESSION: Negative portable chest. Electronically Signed   By: Marlise Simpers M.D.   On: 05/16/2023 05:55     PROCEDURES:  Critical Care performed: No  .1-3 Lead EKG Interpretation  Performed by: Norlene Beavers, MD Authorized by: Norlene Beavers, MD     Interpretation: normal     ECG rate:  80   ECG rate assessment: normal     Rhythm: sinus rhythm     Ectopy: none     Conduction: normal   Comments:     Patient placed on cardiac monitor to evaluate for arrhythmias    MEDICATIONS ORDERED IN ED: Medications  aspirin chewable tablet 324 mg (324 mg Oral Given 05/16/23 0529)  sodium chloride  0.9 % bolus 1,000 mL (1,000 mLs Intravenous Bolus 05/16/23 0637)  sodium chloride  0.9 % bolus 1,000 mL (1,000 mLs Intravenous Bolus 05/16/23 0636)     IMPRESSION / MDM / ASSESSMENT AND PLAN / ED COURSE  I reviewed the triage vital signs and the nursing notes.                             38 year old male presenting with chest pain. Differential diagnosis includes, but is not limited to, ACS, aortic dissection, pulmonary embolism, cardiac tamponade, pneumothorax, pneumonia, pericarditis, myocarditis, GI-related causes including esophagitis/gastritis, and musculoskeletal chest wall pain.   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  Patient's presentation is most consistent with acute complicated illness / injury requiring diagnostic workup.  The patient is on the cardiac monitor to evaluate for evidence of arrhythmia and/or significant heart rate changes.  Will obtain cardiac panel, D-dimer, chest x-ray.  Administer baby aspirin and reassess.  Clinical Course as of 05/16/23 0654  Wed May 16, 2023  0634 Updated patient on laboratory results significant for elevated CK.  Will repeat troponin, repeat CK after 2 L IV fluids.  If CK is stable or downtrending, anticipate patient may be safely discharged home.  Care be transferred to the oncoming provider at change of shift pending these results. [JS]    Clinical Course User Index [JS] Norlene Beavers, MD     FINAL CLINICAL IMPRESSION(S) / ED DIAGNOSES   Final diagnoses:  Chest pain, unspecified type  Non-traumatic rhabdomyolysis     Rx / DC Orders   ED Discharge Orders     None        Note:  This document was prepared using Dragon voice recognition software and may include unintentional dictation errors.   Dian Laprade J, MD 05/16/23 [phone]

## 2023-05-16 NOTE — ED Triage Notes (Signed)
 Pt reports he was seen earlier at [facility] with chest pain and was diagnosed with rhabdomyolysis but since coming home his chest pain seems worse when he lies down and he is concerned about pericarditis.

## 2023-05-16 NOTE — Discharge Instructions (Signed)
 Take a break from working out x 3 days, then slowly resume. Do not overexert yourself and drink plenty of fluids. Return to the ER for worsening symptoms, persistent vomiting, difficulty breathing or other concerns.

## 2023-05-16 NOTE — ED Notes (Signed)
 Pt encouraged to give a urine sample. Pt given a specimen cup for same.

## 2023-07-16 ENCOUNTER — Encounter (HOSPITAL_COMMUNITY): Payer: Self-pay

## 2023-07-16 ENCOUNTER — Emergency Department (HOSPITAL_COMMUNITY)
Admission: EM | Admit: 2023-07-16 | Discharge: 2023-07-16 | Disposition: A | Discharge status: Home / Self Care | Attending: Emergency Medicine | Admitting: Emergency Medicine

## 2023-07-16 ENCOUNTER — Other Ambulatory Visit: Payer: Self-pay

## 2023-07-16 ENCOUNTER — Emergency Department (HOSPITAL_COMMUNITY)

## 2023-07-16 DIAGNOSIS — I319 Disease of pericardium, unspecified: Secondary | ICD-10-CM

## 2023-07-16 DIAGNOSIS — I309 Acute pericarditis, unspecified: Secondary | ICD-10-CM | POA: Insufficient documentation

## 2023-07-16 DIAGNOSIS — R079 Chest pain, unspecified: Secondary | ICD-10-CM | POA: Diagnosis present

## 2023-07-16 HISTORY — DX: Disease of pericardium, unspecified: I31.9

## 2023-07-16 LAB — CBC WITH DIFFERENTIAL/PLATELET
Abs Immature Granulocytes: 0.05 K/uL (ref 0.00–0.07)
Basophils Absolute: 0 K/uL (ref 0.0–0.1)
Basophils Relative: 0 %
Eosinophils Absolute: 0.2 K/uL (ref 0.0–0.5)
Eosinophils Relative: 2 %
HCT: 41.7 % (ref 39.0–52.0)
Hemoglobin: 13.7 g/dL (ref 13.0–17.0)
Immature Granulocytes: 0 %
Lymphocytes Relative: 9 %
Lymphs Abs: 1.1 K/uL (ref 0.7–4.0)
MCH: 29.3 pg (ref 26.0–34.0)
MCHC: 32.9 g/dL (ref 30.0–36.0)
MCV: 89.3 fL (ref 80.0–100.0)
Monocytes Absolute: 1 K/uL (ref 0.1–1.0)
Monocytes Relative: 8 %
Neutro Abs: 9.5 K/uL — ABNORMAL HIGH (ref 1.7–7.7)
Neutrophils Relative %: 81 %
Platelets: 338 K/uL (ref 150–400)
RBC: 4.67 MIL/uL (ref 4.22–5.81)
RDW: 15.7 % — ABNORMAL HIGH (ref 11.5–15.5)
WBC: 11.9 K/uL — ABNORMAL HIGH (ref 4.0–10.5)
nRBC: 0 % (ref 0.0–0.2)

## 2023-07-16 LAB — COMPREHENSIVE METABOLIC PANEL WITH GFR
ALT: 50 U/L — ABNORMAL HIGH (ref 0–44)
AST: 41 U/L (ref 15–41)
Albumin: 3.3 g/dL — ABNORMAL LOW (ref 3.5–5.0)
Alkaline Phosphatase: 57 U/L (ref 38–126)
Anion gap: 12 (ref 5–15)
BUN: 18 mg/dL (ref 6–20)
CO2: 23 mmol/L (ref 22–32)
Calcium: 8.2 mg/dL — ABNORMAL LOW (ref 8.9–10.3)
Chloride: 104 mmol/L (ref 98–111)
Creatinine, Ser: 0.81 mg/dL (ref 0.61–1.24)
GFR, Estimated: >60 mL/min (ref >60)
Glucose, Bld: 110 mg/dL — ABNORMAL HIGH (ref 70–99)
Potassium: 3.8 mmol/L (ref 3.5–5.1)
Sodium: 139 mmol/L (ref 135–145)
Total Bilirubin: 0.5 mg/dL (ref 0.0–1.2)
Total Protein: 6 g/dL — ABNORMAL LOW (ref 6.5–8.1)

## 2023-07-16 LAB — TROPONIN I (HIGH SENSITIVITY): Troponin I (High Sensitivity): 7 ng/L (ref <18)

## 2023-07-16 MED ORDER — COLCHICINE 0.6 MG PO TABS: 0.6000 mg | ORAL_TABLET | Freq: Every day | ORAL | 0 refills | Status: AC

## 2023-07-16 NOTE — ED Notes (Signed)
 Pt reports he has been working out in the heat a lot recently.

## 2023-07-16 NOTE — Discharge Instructions (Signed)
 Thankfully your testing is all been reassuring and unremarkable.  [facility] are [facility] better.  In the meantime I have prescribed a medication called colchicine  which I want you to take 2 pills tonight and then 1 pill a day starting tomorrow for the next 2 weeks.  This should give you plenty of time to follow-up in the clinic.  Please return for severe or worsening symptoms.  It is okay to take ibuprofen  with this medication.  Be aware that this medication may cause mild diarrhea and if that occurs.  The medication and call your doctor

## 2023-07-16 NOTE — ED Provider Notes (Signed)
 [place] EMERGENCY DEPARTMENT AT [facility] Provider Note   CSN: [id number] Arrival date & time: 07/16/23  1335     Patient presents with: Chest Pain   Vincent Ponce is a 38 y.o. male.    Chest Pain  Felt normal prior, CP started today - noticed it worse when he laid down for a nap - started early in the day - but got worse later - it is sharp and stabbing -worse with lying down, a little bit worse when taking a deep breath, not associated with swelling of the legs.  Echocardiogram was repeated at his cardiologist in the outpatient setting and he reports he was told it was normal and he was cleared to go back to work  Feels similar to prior pericarditis dx - he saw his cardiologist with th [facility] and was told everything looked OK - was cleared wtihout restrictions - had echo  which showed small effusion with normal EF.  Sx has some SOB and fatigue with it. No ha, fevers, chills, travel or any other sx.    Prior to Admission medications   Medication Sig Start Date End Date Taking? Authorizing Provider  anastrozole (ARIMIDEX) 1 MG tablet Take 1 mg by mouth daily.   Yes [provider]  ibuprofen  (ADVIL ) 600 MG tablet Take 1 tablet (600 mg total) by mouth every 6 (six) hours as needed. 05/16/23  Yes Nanavati, Ankit, MD  lisdexamfetamine (VYVANSE) 30 MG capsule Take 30 mg by mouth every morning. 01/18/23  Yes [provider]  tadalafil (CIALIS) 5 MG tablet Take 5 mg by mouth daily. 06/07/22  Yes [provider]  testosterone cypionate (DEPOTESTOSTERONE CYPIONATE) 200 MG/ML injection SMARTSIG:0.4 Milliliter(s) IM Once a Week 06/16/22  Yes [provider]    Allergies: Poison sumac extract    Review of Systems  Cardiovascular:  Positive for chest pain.  All other systems reviewed and are negative.   Updated Vital Signs BP 123/72 (BP Location: Right Arm)   Pulse 95   Temp 98.2 F (36.8 C)   Resp 17   SpO2 98%   Physical  Exam Vitals and nursing note reviewed.  Constitutional:      General: He is not in acute distress.    Appearance: He is well-developed.  HENT:     Head: Normocephalic and atraumatic.     Mouth/Throat:     Pharynx: No oropharyngeal exudate.  Eyes:     General: No scleral icterus.       Right eye: No discharge.        Left eye: No discharge.     Conjunctiva/sclera: Conjunctivae normal.     Pupils: Pupils are equal, round, and reactive to light.  Neck:     Thyroid: No thyromegaly.     Vascular: No JVD.  Cardiovascular:     Rate and Rhythm: Normal rate and regular rhythm.     Heart sounds: Normal heart sounds. No murmur heard.    No friction rub. No gallop.  Pulmonary:     Effort: Pulmonary effort is normal. No respiratory distress.     Breath sounds: Normal breath sounds. No wheezing or rales.  Abdominal:     General: Bowel sounds are normal. There is no distension.     Palpations: Abdomen is soft. There is no mass.     Tenderness: There is no abdominal tenderness.  Musculoskeletal:        General: No tenderness. Normal range of motion.  Cervical back: Normal range of motion and neck supple.  Lymphadenopathy:     Cervical: No cervical adenopathy.  Skin:    General: Skin is warm and dry.     Findings: No erythema or rash.  Neurological:     Mental Status: He is alert.     Coordination: Coordination normal.  Psychiatric:        Behavior: Behavior normal.     (all labs ordered are listed, but only abnormal results are displayed) Labs Reviewed  CBC WITH DIFFERENTIAL/PLATELET - Abnormal; Notable for the following components:      Result Value   WBC 11.9 (*)    RDW 15.7 (*)    Neutro Abs 9.5 (*)    All other components within normal limits  COMPREHENSIVE METABOLIC PANEL WITH GFR  TROPONIN I (HIGH SENSITIVITY)    EKG: EKG Interpretation Date/Time:  Monday July 16 2023 13:59:09 EDT Ventricular Rate:  89 PR Interval:  142 QRS Duration:  92 QT Interval:  343 QTC  Calculation: 418 R Axis:   62  Text Interpretation: Sinus rhythm Confirmed by Cleotilde Rogue ([id number]) on 07/16/2023 2:01:42 PM  Radiology: DG Chest 2 View Result Date: 07/16/2023 CLINICAL DATA:  Chest pain EXAM: CHEST - 2 VIEW COMPARISON:  May 16, 2023 FINDINGS: No focal consolidation. No pleural effusions. No pneumothorax. Cardiomediastinal silhouette is unchanged. No acute osseous findings. IMPRESSION: No acute findings. Electronically Signed   By: Michaeline Blanch M.D.   On: 07/16/2023 14:13     Procedures   Medications Ordered in the ED - No data to display                                  Medical Decision Making Amount and/or Complexity of Data Reviewed Labs: ordered. Radiology: ordered.  Risk Prescription drug management.    This patient presents to the ED for concern of chest pain, this involves an extensive number of treatment options, and is a complaint that carries with it a high risk of complications and morbidity.  The differential diagnosis includes pericarditis, pneumothorax, nonspecific chest pain, coronary disease but that seems less likely as there is pulmonary embolism, the patient is not tachycardic or hypoxic and has normal blood pressure and no edema of the legs   Co morbidities / Chronic conditions that complicate the patient evaluation  None, the patient takes no daily medicines   Additional history obtained:  Additional history obtained from EMR External records from outside source obtained and reviewed including record including prior echocardiogram   Lab Tests:  I Ordered, and personally interpreted labs.  The pertinent results include: No leukocytosis   Imaging Studies ordered:  I ordered imaging studies including chest x-ray I independently visualized and interpreted imaging which showed no acute findings I agree with the radiologist interpretation   Cardiac Monitoring: / EKG:  The patient was maintained on a cardiac monitor.  I personally  viewed and interpreted the cardiac monitored which showed an underlying rhythm of: Normal sinus rhythm   Problem List / ED Course / Critical interventions / Medication management  The patient presents with positional sharp pain that is worse with breathing, vital signs EKG and chest x-ray are unremarkable, history of pericarditis. I ordered medication including colchicine  for home I placed the echocardiogram probe on the patient and did a cardiac ultrasound which shows no pericardial effusion of any significant degree, barely noticeable and no posterior fluid was seen.  There is no compression of the ventricles, grossly normal ejection fraction and valvular function based on the limitations of the bedside ultrasound machine I have reviewed the patients home medicines and have made adjustments as needed    Social Determinants of Health:  Counseled patient on avoiding over-the-counter medications, any supplements and try to avoid alcohol   Test / Admission - Considered:  I considered admission but the patient is well-appearing stable for discharge      Final diagnoses:  Pericarditis, unspecified chronicity, unspecified type    ED Discharge Orders     None          Cleotilde Rogue, MD 07/16/23 2238

## 2023-07-16 NOTE — ED Triage Notes (Signed)
 Pt arrived via POV c/o upper left chest pain that began this morning. 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 Pt reports following up with cardiology after last visit to the ED. Pt endorses fatigue.

## 2023-07-19 NOTE — ED Notes (Signed)
 Pt called looking to get his work note extended longer. CN informed pt on policy of work notes. 07/19/2023 @0920hrs 

## 2023-07-20 ENCOUNTER — Encounter: Payer: Self-pay | Admitting: Emergency Medicine

## 2024-02-04 ENCOUNTER — Ambulatory Visit
Admission: EM | Admit: 2024-02-04 | Discharge: 2024-02-04 | Disposition: A | Discharge status: Home / Self Care | Source: Home / Self Care

## 2024-02-04 DIAGNOSIS — J208 Acute bronchitis due to other specified organisms: Secondary | ICD-10-CM

## 2024-02-04 MED ORDER — PREDNISONE 20 MG PO TABS: 40.0000 mg | ORAL_TABLET | Freq: Every day | ORAL | 0 refills | Status: AC

## 2024-02-04 MED ORDER — PROMETHAZINE-DM 6.25-15 MG/5ML PO SYRP: 5.0000 mL | ORAL_SOLUTION | Freq: Four times a day (QID) | ORAL | 0 refills | Status: AC | PRN

## 2024-02-04 NOTE — ED Triage Notes (Signed)
 Pt reports cough,congestion, coughing up bloody mucus that is bright red and chunks of dark red blood. Pain in the chest. was seen at [facility] treated for the flu.

## 2024-02-04 NOTE — ED Provider Notes (Signed)
 " [facility]    CSN: [id number] Arrival date & time: 02/04/24  1249      History   Chief Complaint No chief complaint on file.   HPI Vincent Ponce is a 39 y.o. male.   Patient presenting today with 5-day history of ongoing congestion, cough, hemoptysis, fatigue, body aches.  Denies chest pain, shortness of breath, abdominal pain, vomiting, diarrhea.  [facility], diagnosed with the flu and given a steroid shot, cough syrup and albuterol inhaler.  States he is out of the cough syrup, and has not been using the albuterol inhaler.  No known history of chronic pulmonary disease.    Past Medical History:  Diagnosis Date   ADD (attention deficit disorder)    Alcoholism (HCC)    1 year ago.   Depression    Heat stroke    Pericarditis    PTSD (post-traumatic stress disorder)    Rhabdomyolysis    Sleep apnea     Patient Active Problem List   Diagnosis Date Noted   History of rhabdomyolysis 09/09/2012    Past Surgical History:  Procedure Laterality Date   KNEE SURGERY     MUSCLE BIOPSY         Home Medications    Prior to Admission medications  Medication Sig Start Date End Date Taking? Authorizing Provider  predniSONE  (DELTASONE ) 20 MG tablet Take 2 tablets (40 mg total) by mouth daily with breakfast. 02/04/24  Yes Stuart Vernell Norris, PA-C  promethazine -dextromethorphan (PROMETHAZINE -DM) 6.25-15 MG/5ML syrup Take 5 mLs by mouth 4 (four) times daily as needed. 02/04/24  Yes Stuart Vernell Norris, PA-C  anastrozole (ARIMIDEX) 1 MG tablet Take 1 mg by mouth daily.    [provider]  colchicine  0.6 MG tablet Take 1 tablet (0.6 mg total) by mouth daily for 16 days. 07/16/23 08/01/23  Cleotilde Rogue, MD  ibuprofen  (ADVIL ) 600 MG tablet Take 1 tablet (600 mg total) by mouth every 6 (six) hours as needed. 05/16/23   Charlyn Sora, MD  lisdexamfetamine (VYVANSE) 30 MG capsule Take 30 mg by mouth every morning. 01/18/23    [provider]  tadalafil (CIALIS) 5 MG tablet Take 5 mg by mouth daily. 06/07/22   [provider]  testosterone cypionate (DEPOTESTOSTERONE CYPIONATE) 200 MG/ML injection SMARTSIG:0.4 Milliliter(s) IM Once a Week 06/16/22   [provider]    Family History Family History  Problem Relation Age of Onset   Cancer Mother    Cancer Maternal Grandmother    Cancer Maternal Grandfather     Social History Social History[1]   Allergies   Poison sumac extract   Review of Systems Review of Systems Per HPI  Physical Exam Triage Vital Signs ED Triage Vitals  Encounter Vitals Group     BP 02/04/24 1322 127/80     Girls Systolic BP Percentile --      Girls Diastolic BP Percentile --      Boys Systolic BP Percentile --      Boys Diastolic BP Percentile --      Pulse Rate 02/04/24 1322 67     Resp 02/04/24 1322 20     Temp 02/04/24 1322 98 F (36.7 C)     Temp Source 02/04/24 1322 Oral     SpO2 02/04/24 1322 98 %     Weight --      Height --      Head Circumference --      Peak Flow --  Pain Score 02/04/24 1325 6     Pain Loc --      Pain Education --      Exclude from Growth Chart --    No data found.  Updated Vital Signs BP 127/80 (BP Location: Right Arm)   Pulse 67   Temp 98 F (36.7 C) (Oral)   Resp 20   SpO2 98%   Visual Acuity Right Eye Distance:   Left Eye Distance:   Bilateral Distance:    Right Eye Near:   Left Eye Near:    Bilateral Near:     Physical Exam Vitals and nursing note reviewed.  Constitutional:      Appearance: He is well-developed.  HENT:     Head: Atraumatic.     Right Ear: Tympanic membrane and external ear normal.     Left Ear: Tympanic membrane and external ear normal.     Nose: Rhinorrhea present.     Mouth/Throat:     Pharynx: Posterior oropharyngeal erythema present. No oropharyngeal exudate.  Eyes:     Conjunctiva/sclera: Conjunctivae normal.     Pupils: Pupils are equal, round, and reactive  to light.  Cardiovascular:     Rate and Rhythm: Normal rate and regular rhythm.  Pulmonary:     Effort: Pulmonary effort is normal. No respiratory distress.     Breath sounds: No wheezing or rales.  Musculoskeletal:        General: Normal range of motion.     Cervical back: Normal range of motion and neck supple.  Lymphadenopathy:     Cervical: No cervical adenopathy.  Skin:    General: Skin is warm and dry.  Neurological:     Mental Status: He is alert and oriented to person, place, and time.  Psychiatric:        Behavior: Behavior normal.      UC Treatments / Results  Labs (all labs ordered are listed, but only abnormal results are displayed) Labs Reviewed - No data to display  EKG   Radiology No results found.  Procedures Procedures (including critical care time)  Medications Ordered in UC Medications - No data to display  Initial Impression / Assessment and Plan / UC Course  I have reviewed the triage vital signs and the nursing notes.  Pertinent labs & imaging results that were available during my care of the patient were reviewed by me and considered in my medical decision making (see chart for details).     Vital signs and exam reassuring today, suspect ongoing postviral symptoms.  Treat for viral bronchitis with prednisone , Phenergan  DM, continued albuterol and supportive over-the-counter medications and home care.  Return for worsening or unresolving symptoms.  Final Clinical Impressions(s) / UC Diagnoses   Final diagnoses:  Viral bronchitis     Discharge Instructions      In addition to the prescribed medications, you may use your albuterol inhaler every 4 hours as needed, take DayQuil, NyQuil, use humidifiers and saline sinus rinses.  Follow-up for worsening or unresolving symptoms    ED Prescriptions     Medication Sig Dispense Auth. Provider   predniSONE  (DELTASONE ) 20 MG tablet Take 2 tablets (40 mg total) by mouth daily with breakfast. 10  tablet Stuart Vernell Norris, PA-C   promethazine -dextromethorphan (PROMETHAZINE -DM) 6.25-15 MG/5ML syrup Take 5 mLs by mouth 4 (four) times daily as needed. 100 mL Stuart Vernell Norris, [place]      PDMP not reviewed this encounter.    [1]  Social History Tobacco  Use   Smoking status: Former   Smokeless tobacco: Current    Types: Snuff  Vaping Use   Vaping status: Never Used  Substance Use Topics   Alcohol use: Not Currently    Comment: few times a month   Drug use: No     Stuart Vernell Norris, PA-C 02/04/24 1435  "

## 2024-02-04 NOTE — Discharge Instructions (Signed)
 In addition to the prescribed medications, you may use your albuterol inhaler every 4 hours as needed, take DayQuil, NyQuil, use humidifiers and saline sinus rinses.  Follow-up for worsening or unresolving symptoms
